# Patient Record
Sex: Male | Born: 1970 | Race: Black or African American | Hispanic: No | Marital: Single | Smoking: Current every day smoker
Health system: Southern US, Community
[De-identification: ages and names within clinical notes are randomized; demographics above are authoritative.]

## PROBLEM LIST (undated history)

## (undated) DIAGNOSIS — G4730 Sleep apnea, unspecified: Secondary | ICD-10-CM

## (undated) DIAGNOSIS — I272 Pulmonary hypertension, unspecified: Secondary | ICD-10-CM

## (undated) DIAGNOSIS — J449 Chronic obstructive pulmonary disease, unspecified: Secondary | ICD-10-CM

## (undated) DIAGNOSIS — J40 Bronchitis, not specified as acute or chronic: Secondary | ICD-10-CM

## (undated) DIAGNOSIS — G629 Polyneuropathy, unspecified: Secondary | ICD-10-CM

## (undated) DIAGNOSIS — H409 Unspecified glaucoma: Secondary | ICD-10-CM

## (undated) DIAGNOSIS — I10 Essential (primary) hypertension: Secondary | ICD-10-CM

---

## 2005-07-25 ENCOUNTER — Ambulatory Visit (HOSPITAL_COMMUNITY): Admission: RE | Admit: 2005-07-25 | Discharge: 2005-07-27 | Payer: Self-pay | Admitting: Orthopedic Surgery

## 2005-07-25 ENCOUNTER — Encounter: Admission: RE | Admit: 2005-07-25 | Discharge: 2005-07-25 | Payer: Self-pay | Admitting: Family Medicine

## 2008-08-31 ENCOUNTER — Encounter: Admission: RE | Admit: 2008-08-31 | Discharge: 2008-08-31 | Payer: Self-pay | Admitting: Family Medicine

## 2010-04-03 ENCOUNTER — Emergency Department (HOSPITAL_COMMUNITY): Admission: EM | Admit: 2010-04-03 | Discharge: 2010-04-03 | Payer: Self-pay | Admitting: Family Medicine

## 2010-11-04 NOTE — H&P (Signed)
NAMEMICHOEL, KUNIN                 ACCOUNT NO.:  [id number]   MEDICAL RECORD NO.:  [id number]          PATIENT TYPE:  AMB   LOCATION:  SDS                          FACILITY:  [facility]   PHYSICIAN:  Myrtie Neither, MD      DATE OF BIRTH:  [birth year]   DATE OF ADMISSION:  07/25/2005  DATE OF DISCHARGE:                                HISTORY & PHYSICAL   CHIEF COMPLAINT:  Painful, deformed right second toe.   PERTINENT HISTORY:  This is a 40 year old male who this past Saturday, four  days ago, had stumped his right foot after getting in a tub basin and by  walking in the dark and sustained an injury to his right second toe with  pain, swelling, some minimal bleeding.  The patient went to Dr. Salena Saner earlier today and was subsequently referred to the office.   PAST MEDICAL HISTORY:  No history of high blood pressure or diabetes.   ALLERGIES:  None known.   MEDICATIONS:  Cialis, Alavert, B12.   SOCIAL HISTORY:  The patient does have a history of use of alcohol and  smokes 1-1/2 packs per day.   FAMILY HISTORY:  Noncontributory.   REVIEW OF SYSTEMS:  Basically as in history of present illness.  No cardiac,  respiratory, urinary or bowel symptoms.   PHYSICAL EXAMINATION:  VITAL SIGNS:  Temperature 97.6, pulse 68,  respirations 16, blood pressure 134/75.  Height 75 inches.  Weight 296.  HEENT:  Head normocephalic.  Anicteric.  Sclerae are clear.  NECK:  Supple.  LUNGS:  Clear.  HEART:  S1 and S2 regular.  EXTREMITIES:  Right foot with tender, swollen right second toe.  Some  erythema with bone protruding through the dorsal wound.  Tender to  palpation.   X-ray reveals fracture, proximal phalanx.   IMPRESSION:  Open fractured phalanx, right second toe.   PLAN:  Irrigation/debridement, partial phalangectomy and open  reduction/internal fixation, right second toe.      Myrtie Neither, MD  Electronically Signed     AC/MEDQ  D:  07/25/2005  T:  07/25/2005  Job:   [id number]

## 2010-11-04 NOTE — H&P (Signed)
[name], Wayne Wilkerson                 ACCOUNT NO.:  [id number]   MEDICAL RECORD NO.:  [id number]          PATIENT TYPE:  AMB   LOCATION:  SDS                          FACILITY:  [facility]   PHYSICIAN:  Myrtie Neither, MD      DATE OF BIRTH:  1970/11/13   DATE OF ADMISSION:  07/25/2005  DATE OF DISCHARGE:                                HISTORY & PHYSICAL   PREOPERATIVE DIAGNOSIS:  Open fracture to proximal phalanx, right second  toe.   POSTOPERATIVE DIAGNOSIS:  Open fracture to proximal phalanx, right second  toe.   ANESTHESIA:  General.   PROCEDURE:  Irrigation and debridement, right second toe, partial  phalangectomy, proximal phalanx, right second toe.  Open reduction/internal  fixation, proximal phalanx, right second toe.  Culture and sensitivity,  wound.   Patient is taken to the operating room after given adequate preoperative  medication.  Given general anesthesia and intubated.  Right foot was  scrubbed with Betadine scrub and painted with Betadine solution.  Draped in  a sterile manner.  Tourniquet and bipolar used for hemostasis.  Transverse  laceration, dorsal aspect of the second toe was extended both proximally and  distally for the exposure of both the proximal and distal fragments.  With  the use of a bone cutter, a partial phalangectomy was done, resecting the  dead ends of the bone. Copious irrigation with Simpulse was done.  The wound  was cultured for aerobics and anaerobic after adequate debridement and  irrigation.  A 6.2 K wire was placed across the fracture site, stabilizing  in anatomic position.  Further irrigation was done, followed by wound  closure with 4-0 nylon.  The pin was bent exterior to the skin, and pin cap  applied.  Compressive dressing was applied.  The patient tolerated the  procedure quite well and went to the recovery room in a stable and  satisfactory condition.      Myrtie Neither, MD  Electronically Signed     AC/MEDQ  D:  07/25/2005   T:  07/25/2005  Job:  [id number]

## 2012-09-24 ENCOUNTER — Institutional Professional Consult (permissible substitution): Payer: Self-pay | Admitting: Medical

## 2013-01-27 ENCOUNTER — Encounter (HOSPITAL_COMMUNITY): Payer: Self-pay | Admitting: *Deleted

## 2013-01-27 ENCOUNTER — Emergency Department (HOSPITAL_COMMUNITY)
Admission: EM | Admit: 2013-01-27 | Discharge: 2013-01-27 | Disposition: A | Discharge status: Home / Self Care | Payer: BC Managed Care – PPO | Attending: Emergency Medicine | Admitting: Emergency Medicine

## 2013-01-27 DIAGNOSIS — R000 Tachycardia, unspecified: Secondary | ICD-10-CM | POA: Insufficient documentation

## 2013-01-27 DIAGNOSIS — F17200 Nicotine dependence, unspecified, uncomplicated: Secondary | ICD-10-CM | POA: Insufficient documentation

## 2013-01-27 DIAGNOSIS — Z79899 Other long term (current) drug therapy: Secondary | ICD-10-CM | POA: Insufficient documentation

## 2013-01-27 DIAGNOSIS — G4730 Sleep apnea, unspecified: Secondary | ICD-10-CM | POA: Insufficient documentation

## 2013-01-27 DIAGNOSIS — F411 Generalized anxiety disorder: Secondary | ICD-10-CM | POA: Insufficient documentation

## 2013-01-27 DIAGNOSIS — K0889 Other specified disorders of teeth and supporting structures: Secondary | ICD-10-CM

## 2013-01-27 DIAGNOSIS — K089 Disorder of teeth and supporting structures, unspecified: Secondary | ICD-10-CM | POA: Insufficient documentation

## 2013-01-27 DIAGNOSIS — Z760 Encounter for issue of repeat prescription: Secondary | ICD-10-CM | POA: Insufficient documentation

## 2013-01-27 HISTORY — DX: Sleep apnea, unspecified: G47.30

## 2013-01-27 MED ORDER — PENICILLIN V POTASSIUM 500 MG PO TABS: 500.0000 mg | ORAL_TABLET | Freq: Four times a day (QID) | ORAL | Status: DC

## 2013-01-27 NOTE — ED Notes (Signed)
Pt states last year during his physical for work he took two BP pills to be able to pass his BP to work. Pt states that his physical is coming up this week and he wants BP medication so he can pass his physical. Pt also complaining of dental pain.

## 2013-01-27 NOTE — ED Provider Notes (Signed)
CSN: [id number]     Arrival date & time 01/27/13  0446 History     First MD Initiated Contact with Patient 01/27/13 [phone]     Chief Complaint  Patient presents with  . 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 Patient states he has had "borderline" blood pressure for a few years. He checked his blood pressure the other day and it was elevated. Patient states he took some blood pressure pills last year, given to him by a co-worker, so he was able to pass his test. Patient denies any current symptoms related to blood pressure. Patient states he also has dental pain for the past few weeks that is aching and mild. The pain "is not that bad." No aggravating/alleviating factors.   Patient is a 42 y.o. male presenting with hypertension.  Hypertension    Past Medical History  Diagnosis Date  . Sleep apnea    History reviewed. No pertinent past surgical history. History reviewed. No pertinent family history. History  Substance Use Topics  . Smoking status: Current Every Day Smoker  . Smokeless tobacco: Not on file  . Alcohol Use: Yes    Review of Systems  Constitutional:       Medication refill  HENT: Positive for dental problem.   All other systems reviewed and are negative.    Allergies  Review of patient's allergies indicates no known allergies.  Home Medications   Current Outpatient Rx  Name  Route  Sig  Dispense  Refill  . fluticasone (FLONASE) 50 MCG/ACT nasal spray   Nasal   Place 2 sprays into the nose daily as needed for rhinitis or allergies.         . Loratadine-Pseudoephedrine (KLS ALLERCLEAR D-12HR PO)   Oral   Take 1 tablet by mouth 2 (two) times daily as needed (for allergies).          BP 139/92   Pulse 105  Temp(Src) 98.2 F (36.8 C) (Oral)  Resp 20  SpO2 93% Physical Exam  Nursing note and vitals reviewed. Constitutional: He is oriented to person, place, and time. He appears well-developed and well-nourished. No distress.  HENT:  Head: Normocephalic and atraumatic.  Mouth/Throat: Oropharynx is clear and moist. No oropharyngeal exudate.  Fair dentition. No tenderness to percussion.   Eyes: Conjunctivae and EOM are normal.  Neck: Normal range of motion.  Cardiovascular: Normal rate and regular rhythm.  Exam reveals no gallop and no friction rub.   No murmur heard. Pulmonary/Chest: Effort normal and breath sounds normal. He has no wheezes. He has no rales. He exhibits no tenderness.  Abdominal: Soft. He exhibits no distension. There is no tenderness. There is no rebound and no guarding.  Musculoskeletal: Normal range of motion.  Neurological: He is alert and oriented to person, place, and time. Coordination normal.  Speech is goal-oriented. Moves limbs without ataxia.   Skin: Skin is warm and dry.  Psychiatric:  Anxious affect.     ED Course   Procedures (including critical care time)  Labs Reviewed - No data to display No results found.  1. Encounter for medication refill   2. Pain, dental     MDM  6:17 AM Patient tachycardic with other vitals stable.   6:39 AM Patient will not have blood pressure medication  here. Patient will have lifestyle and diet recommendations and follow up with a primary car doctor. I have explained to the patient that his request is not legal for me to agree to. Patient will have recommended management by primary care doctor on resource guide. Patient will have penicillin for dental pain. Vitals stable and patient afebrile. No further evaluation needed at this time.    Emilia Beck, [place] 01/27/13 [phone]

## 2013-01-27 NOTE — ED Notes (Signed)
0500  Pt arrives to the room for medication refill and dental pain.  Pt states that he needs some BP meds to pass his work physical.

## 2013-01-28 NOTE — ED Provider Notes (Signed)
Medical screening examination/treatment/procedure(s) were performed by non-physician practitioner and as supervising physician I was immediately available for consultation/collaboration.   Hanley Seamen, MD 01/28/13 [phone]

## 2014-09-10 ENCOUNTER — Ambulatory Visit (INDEPENDENT_AMBULATORY_CARE_PROVIDER_SITE_OTHER): Payer: BLUE CROSS/BLUE SHIELD

## 2014-09-10 ENCOUNTER — Encounter: Payer: Self-pay | Admitting: Podiatry

## 2014-09-10 ENCOUNTER — Ambulatory Visit (INDEPENDENT_AMBULATORY_CARE_PROVIDER_SITE_OTHER): Payer: BLUE CROSS/BLUE SHIELD | Admitting: Podiatry

## 2014-09-10 VITALS — BP 120/73 | HR 103 | Resp 16

## 2014-09-10 DIAGNOSIS — B351 Tinea unguium: Secondary | ICD-10-CM

## 2014-09-10 DIAGNOSIS — Q6650 Congenital pes planus, unspecified foot: Secondary | ICD-10-CM | POA: Diagnosis not present

## 2014-09-10 DIAGNOSIS — M722 Plantar fascial fibromatosis: Secondary | ICD-10-CM | POA: Diagnosis not present

## 2014-09-10 DIAGNOSIS — M79673 Pain in unspecified foot: Secondary | ICD-10-CM | POA: Diagnosis not present

## 2014-09-10 NOTE — Progress Notes (Signed)
   Subjective:    Patient ID: Wayne Wilkerson, male    DOB: June 12, 1971, 44 y.o.   MRN: [id number]  HPI Comments: "I need some new orthotics"  Patient states he needs new orthotics. His last pair from 2012 are worn out and flat.   He also would like to get his toenails cut. Can't get down to feet well to cut them.     Review of Systems  HENT: Positive for sinus pressure.   Musculoskeletal: Positive for gait problem.  All other systems reviewed and are negative.      Objective:   Physical Exam: I have reviewed his past medical history medications allergies surgery social history and review of systems. Pulses are strongly palpable bilateral. Neurologic sensorium is intact bilateral. Deep tendon reflexes are intact bilateral and muscle strength +5 over 5 dorsiflexion plantar flexors and inverters everters all intrinsic musculature is intact. Orthopedic evaluation demonstrates all joints joints distal to the ankle for range of motion without crepitation. He has flexible pes planus bilateral. A history of plantar fasciitis with minimal tenderness on palpation. Cutaneous evaluation demonstrates thick yellow dystrophic onychomycotic nails are painful on palpation.        Assessment & Plan:  Assessment: Pain and limp secondary to onychomycosis pes planus and plantar fasciitis bilateral.  Plan: He was scanned today for set of orthotics and nails were debrided bilaterally.

## 2014-10-20 ENCOUNTER — Ambulatory Visit: Payer: BLUE CROSS/BLUE SHIELD | Admitting: *Deleted

## 2014-10-20 DIAGNOSIS — M722 Plantar fascial fibromatosis: Secondary | ICD-10-CM

## 2014-10-20 NOTE — Patient Instructions (Signed)

## 2014-10-20 NOTE — Progress Notes (Signed)
Patient ID: Sammuel BailiffSean L Lensing, male   DOB: September 26, 1970, 44 y.o.   MRN: [id number] PICKING UP INSERTS

## 2017-04-26 ENCOUNTER — Ambulatory Visit: Payer: BLUE CROSS/BLUE SHIELD | Admitting: Podiatry

## 2017-11-23 ENCOUNTER — Ambulatory Visit: Payer: BLUE CROSS/BLUE SHIELD | Admitting: Podiatry

## 2017-11-27 ENCOUNTER — Ambulatory Visit: Payer: BLUE CROSS/BLUE SHIELD | Admitting: Podiatry

## 2017-11-27 ENCOUNTER — Encounter: Payer: Self-pay | Admitting: Podiatry

## 2017-11-27 VITALS — BP 145/90 | HR 91

## 2017-11-27 DIAGNOSIS — M722 Plantar fascial fibromatosis: Secondary | ICD-10-CM

## 2017-11-27 DIAGNOSIS — M7741 Metatarsalgia, right foot: Secondary | ICD-10-CM | POA: Diagnosis not present

## 2017-11-27 MED ORDER — DICLOFENAC SODIUM 1 % TD GEL: 2.0000 g | Freq: Four times a day (QID) | TRANSDERMAL | 2 refills | Status: DC

## 2017-12-02 NOTE — Progress Notes (Signed)
Subjective:   Patient ID: Sammuel BailiffSean L Keadle, male   DOB: 47 y.o.   MRN: [id number]   HPI 47 year old male presents the office today requesting new orthotics.  Last they were made in 2016 this is they are worn out.  As they were causes her to wear off he started feeling not sensation on the ball of the right side.  He denies any recent injury or trauma to his feet he denies any swelling or redness to his feet.  He has no other concerns today.  He has a history of plantar fasciitis and this is why he had orthotics previously.   Review of Systems  All other systems reviewed and are negative.       Objective:  Physical Exam  General: AAO x3, NAD-was very talkative today and I was unable to actually have a conversation with him.  He kept going in circles but we were able to get a good treatment plan for him.  Dermatological: Skin is warm, dry and supple bilateral. Nails x 10 are well manicured; remaining integument appears unremarkable at this time. There are no open sores, no preulcerative lesions, no rash or signs of infection present.  Vascular: Dorsalis Pedis artery and Posterior Tibial artery pedal pulses are 2/4 bilateral with immedate capillary fill time. There is no pain with calf compression, swelling, warmth, erythema.    Neruologic: Grossly intact via light touch bilateral.  Protective threshold with Semmes Wienstein monofilament intact to all pedal sites bilateral.  Negative Tinel sign.  Musculoskeletal: At this time there is no area pinpoint bony tenderness identified bilaterally.  There is some minimal swelling submetatarsal area 1 through 5 on the right foot compared to contralateral extremity but there is no area of tenderness there is no increase in warmth.  No specific tenderness identified on the plantar fascia today.  Plantar fascia, Achilles tendon appears intact.  Muscular strength 5/5 in all groups tested bilateral.  Gait: Unassisted, Nonantalgic.       Assessment:    Right foot plantar fasciitis, metatarsalgia     Plan:  -Treatment options discussed including all alternatives, risks, and complications -Etiology of symptoms were discussed -Patient declined x-rays today.  I do think that he does have new orthotics He presents today requesting.  He was seen by Raiford Nobleick and he was molded for orthotics.  Also prescribed Voltaren gel.  On clinical exam there is no evidence of acute fracture or stress fracture however if symptoms continue we will get an x-ray.  Discussed gentle range of motion exercises and rehab exercises for plantar fasciitis.  Vivi BarrackMatthew R Wagoner DPM

## 2017-12-05 ENCOUNTER — Telehealth: Payer: Self-pay | Admitting: *Deleted

## 2017-12-05 NOTE — Telephone Encounter (Signed)
[facility] denied Voltaren gel, recommends etodolac, meloxicam, naproxen, nabmetone, ibuprofen.

## 2017-12-06 NOTE — Telephone Encounter (Signed)
Can to topical anti-inflammatory from [facility].

## 2017-12-07 MED ORDER — NONFORMULARY OR COMPOUNDED ITEM: 2 refills | Status: DC

## 2017-12-07 NOTE — Telephone Encounter (Signed)
Left message informing pt his insurance would not cover the diclofenac sodium gel, and Dr. Ardelle AntonWagoner had changed to a compound from [facility] [phone], and they would contact him with the insurance coverage and delivery information. Faxed order to [facility].

## 2017-12-18 ENCOUNTER — Ambulatory Visit (INDEPENDENT_AMBULATORY_CARE_PROVIDER_SITE_OTHER): Payer: BLUE CROSS/BLUE SHIELD | Admitting: Orthotics

## 2017-12-18 DIAGNOSIS — M722 Plantar fascial fibromatosis: Secondary | ICD-10-CM

## 2018-11-29 DIAGNOSIS — I10 Essential (primary) hypertension: Secondary | ICD-10-CM | POA: Insufficient documentation

## 2018-11-29 DIAGNOSIS — R609 Edema, unspecified: Secondary | ICD-10-CM | POA: Insufficient documentation

## 2018-11-29 DIAGNOSIS — K529 Noninfective gastroenteritis and colitis, unspecified: Secondary | ICD-10-CM | POA: Insufficient documentation

## 2018-12-02 DIAGNOSIS — I272 Pulmonary hypertension, unspecified: Secondary | ICD-10-CM | POA: Insufficient documentation

## 2019-04-18 ENCOUNTER — Telehealth: Payer: Self-pay | Admitting: *Deleted

## 2019-04-18 NOTE — Telephone Encounter (Signed)
I informed pt we would need to reevaluate him prior to refill of the voltaren gel due to he has not been seen by Dr. Jacqualyn Posey in almost over 1 1/2 years. Pt states understanding.

## 2019-04-18 NOTE — Telephone Encounter (Signed)
Pt request refill of the Voltaren Gel. Pt has not been seen in office in over 1 year.

## 2019-05-22 DIAGNOSIS — R200 Anesthesia of skin: Secondary | ICD-10-CM | POA: Insufficient documentation

## 2019-05-22 DIAGNOSIS — M79671 Pain in right foot: Secondary | ICD-10-CM | POA: Insufficient documentation

## 2019-06-21 DIAGNOSIS — G629 Polyneuropathy, unspecified: Secondary | ICD-10-CM | POA: Insufficient documentation

## 2019-06-21 DIAGNOSIS — F39 Unspecified mood [affective] disorder: Secondary | ICD-10-CM | POA: Insufficient documentation

## 2019-06-21 DIAGNOSIS — G4733 Obstructive sleep apnea (adult) (pediatric): Secondary | ICD-10-CM | POA: Insufficient documentation

## 2019-10-08 ENCOUNTER — Other Ambulatory Visit: Payer: Self-pay

## 2019-10-08 ENCOUNTER — Encounter: Payer: Self-pay | Admitting: Emergency Medicine

## 2019-10-08 DIAGNOSIS — Z20822 Contact with and (suspected) exposure to covid-19: Secondary | ICD-10-CM | POA: Diagnosis present

## 2019-10-08 DIAGNOSIS — F17200 Nicotine dependence, unspecified, uncomplicated: Secondary | ICD-10-CM | POA: Diagnosis present

## 2019-10-08 DIAGNOSIS — Z79899 Other long term (current) drug therapy: Secondary | ICD-10-CM

## 2019-10-08 DIAGNOSIS — H409 Unspecified glaucoma: Secondary | ICD-10-CM | POA: Diagnosis present

## 2019-10-08 DIAGNOSIS — Z6841 Body Mass Index (BMI) 40.0 and over, adult: Secondary | ICD-10-CM

## 2019-10-08 DIAGNOSIS — A0472 Enterocolitis due to Clostridium difficile, not specified as recurrent: Secondary | ICD-10-CM | POA: Diagnosis present

## 2019-10-08 DIAGNOSIS — Z7951 Long term (current) use of inhaled steroids: Secondary | ICD-10-CM

## 2019-10-08 DIAGNOSIS — E871 Hypo-osmolality and hyponatremia: Secondary | ICD-10-CM | POA: Diagnosis present

## 2019-10-08 DIAGNOSIS — N179 Acute kidney failure, unspecified: Principal | ICD-10-CM | POA: Diagnosis present

## 2019-10-08 DIAGNOSIS — E860 Dehydration: Secondary | ICD-10-CM | POA: Diagnosis present

## 2019-10-08 DIAGNOSIS — J449 Chronic obstructive pulmonary disease, unspecified: Secondary | ICD-10-CM | POA: Diagnosis present

## 2019-10-08 DIAGNOSIS — I272 Pulmonary hypertension, unspecified: Secondary | ICD-10-CM | POA: Diagnosis present

## 2019-10-08 DIAGNOSIS — I10 Essential (primary) hypertension: Secondary | ICD-10-CM | POA: Diagnosis present

## 2019-10-08 DIAGNOSIS — Z791 Long term (current) use of non-steroidal anti-inflammatories (NSAID): Secondary | ICD-10-CM

## 2019-10-08 LAB — COMPREHENSIVE METABOLIC PANEL
ALT: 42 U/L (ref 0–44)
AST: 43 U/L — ABNORMAL HIGH (ref 15–41)
Albumin: 4.2 g/dL (ref 3.5–5.0)
Alkaline Phosphatase: 84 U/L (ref 38–126)
Anion gap: 15 (ref 5–15)
BUN: 29 mg/dL — ABNORMAL HIGH (ref 6–20)
CO2: 21 mmol/L — ABNORMAL LOW (ref 22–32)
Calcium: 8.8 mg/dL — ABNORMAL LOW (ref 8.9–10.3)
Chloride: 98 mmol/L (ref 98–111)
Creatinine, Ser: 5.11 mg/dL — ABNORMAL HIGH (ref 0.61–1.24)
GFR calc Af Amer: 14 mL/min — ABNORMAL LOW (ref >60)
GFR calc non Af Amer: 12 mL/min — ABNORMAL LOW (ref >60)
Glucose, Bld: 116 mg/dL — ABNORMAL HIGH (ref 70–99)
Potassium: 3.7 mmol/L (ref 3.5–5.1)
Sodium: 134 mmol/L — ABNORMAL LOW (ref 135–145)
Total Bilirubin: 0.6 mg/dL (ref 0.3–1.2)
Total Protein: 7.4 g/dL (ref 6.5–8.1)

## 2019-10-08 LAB — CBC WITH DIFFERENTIAL/PLATELET
Abs Immature Granulocytes: 0.15 10*3/uL — ABNORMAL HIGH (ref 0.00–0.07)
Basophils Absolute: 0.1 10*3/uL (ref 0.0–0.1)
Basophils Relative: 0 %
Eosinophils Absolute: 0.3 10*3/uL (ref 0.0–0.5)
Eosinophils Relative: 3 %
HCT: 42.1 % (ref 39.0–52.0)
Hemoglobin: 14 g/dL (ref 13.0–17.0)
Immature Granulocytes: 1 %
Lymphocytes Relative: 23 %
Lymphs Abs: 2.7 10*3/uL (ref 0.7–4.0)
MCH: 34 pg (ref 26.0–34.0)
MCHC: 33.3 g/dL (ref 30.0–36.0)
MCV: 102.2 fL — ABNORMAL HIGH (ref 80.0–100.0)
Monocytes Absolute: 1.2 10*3/uL — ABNORMAL HIGH (ref 0.1–1.0)
Monocytes Relative: 11 %
Neutro Abs: 7.2 10*3/uL (ref 1.7–7.7)
Neutrophils Relative %: 62 %
Platelets: 173 10*3/uL (ref 150–400)
RBC: 4.12 MIL/uL — ABNORMAL LOW (ref 4.22–5.81)
RDW: 14.8 % (ref 11.5–15.5)
WBC: 11.6 10*3/uL — ABNORMAL HIGH (ref 4.0–10.5)
nRBC: 0.3 % — ABNORMAL HIGH (ref 0.0–0.2)

## 2019-10-08 LAB — URINALYSIS, COMPLETE (UACMP) WITH MICROSCOPIC
Bacteria, UA: NONE SEEN
Glucose, UA: NEGATIVE mg/dL
Hgb urine dipstick: NEGATIVE
Ketones, ur: 5 mg/dL — AB
Nitrite: NEGATIVE
Protein, ur: 100 mg/dL — AB
Specific Gravity, Urine: 1.024 (ref 1.005–1.030)
pH: 5 (ref 5.0–8.0)

## 2019-10-08 LAB — TYPE AND SCREEN
ABO/RH(D): AB POS
Antibody Screen: NEGATIVE

## 2019-10-08 LAB — LIPASE, BLOOD: Lipase: 29 U/L (ref 11–51)

## 2019-10-08 NOTE — ED Triage Notes (Signed)
Patient ambulatory to triage with steady gait, without difficulty or distress noted, mask in place; Pt st diarrhea for "several years"; noted blood last several days ago with diff urinating; c/o pain in left side

## 2019-10-09 ENCOUNTER — Emergency Department: Payer: BLUE CROSS/BLUE SHIELD

## 2019-10-09 ENCOUNTER — Inpatient Hospital Stay
Admission: EM | Admit: 2019-10-09 | Discharge: 2019-10-10 | DRG: 683 | Disposition: A | Discharge status: Home / Self Care | Payer: BLUE CROSS/BLUE SHIELD | Attending: Internal Medicine | Admitting: Internal Medicine

## 2019-10-09 ENCOUNTER — Inpatient Hospital Stay: Payer: BLUE CROSS/BLUE SHIELD

## 2019-10-09 DIAGNOSIS — J449 Chronic obstructive pulmonary disease, unspecified: Secondary | ICD-10-CM

## 2019-10-09 DIAGNOSIS — Z20822 Contact with and (suspected) exposure to covid-19: Secondary | ICD-10-CM | POA: Diagnosis present

## 2019-10-09 DIAGNOSIS — Z79899 Other long term (current) drug therapy: Secondary | ICD-10-CM | POA: Diagnosis not present

## 2019-10-09 DIAGNOSIS — Z791 Long term (current) use of non-steroidal anti-inflammatories (NSAID): Secondary | ICD-10-CM | POA: Diagnosis not present

## 2019-10-09 DIAGNOSIS — Z7951 Long term (current) use of inhaled steroids: Secondary | ICD-10-CM | POA: Diagnosis not present

## 2019-10-09 DIAGNOSIS — E860 Dehydration: Secondary | ICD-10-CM | POA: Diagnosis present

## 2019-10-09 DIAGNOSIS — R197 Diarrhea, unspecified: Secondary | ICD-10-CM | POA: Diagnosis not present

## 2019-10-09 DIAGNOSIS — H409 Unspecified glaucoma: Secondary | ICD-10-CM | POA: Diagnosis present

## 2019-10-09 DIAGNOSIS — N179 Acute kidney failure, unspecified: Secondary | ICD-10-CM | POA: Diagnosis present

## 2019-10-09 DIAGNOSIS — I272 Pulmonary hypertension, unspecified: Secondary | ICD-10-CM | POA: Diagnosis present

## 2019-10-09 DIAGNOSIS — E871 Hypo-osmolality and hyponatremia: Secondary | ICD-10-CM | POA: Diagnosis present

## 2019-10-09 DIAGNOSIS — F17200 Nicotine dependence, unspecified, uncomplicated: Secondary | ICD-10-CM | POA: Diagnosis present

## 2019-10-09 DIAGNOSIS — A0472 Enterocolitis due to Clostridium difficile, not specified as recurrent: Secondary | ICD-10-CM | POA: Diagnosis not present

## 2019-10-09 DIAGNOSIS — I10 Essential (primary) hypertension: Secondary | ICD-10-CM

## 2019-10-09 DIAGNOSIS — Z6841 Body Mass Index (BMI) 40.0 and over, adult: Secondary | ICD-10-CM | POA: Diagnosis not present

## 2019-10-09 HISTORY — DX: Unspecified glaucoma: H40.9

## 2019-10-09 HISTORY — DX: Essential (primary) hypertension: I10

## 2019-10-09 HISTORY — DX: Pulmonary hypertension, unspecified: I27.20

## 2019-10-09 HISTORY — DX: Chronic obstructive pulmonary disease, unspecified: J44.9

## 2019-10-09 HISTORY — DX: Polyneuropathy, unspecified: G62.9

## 2019-10-09 HISTORY — DX: Bronchitis, not specified as acute or chronic: J40

## 2019-10-09 LAB — PROTEIN / CREATININE RATIO, URINE
Creatinine, Urine: 389 mg/dL
Protein Creatinine Ratio: 0.12 mg/mg{Cre} (ref 0.00–0.15)
Total Protein, Urine: 46 mg/dL

## 2019-10-09 LAB — RESPIRATORY PANEL BY RT PCR (FLU A&B, COVID)
Influenza A by PCR: NEGATIVE
Influenza B by PCR: NEGATIVE
SARS Coronavirus 2 by RT PCR: NEGATIVE

## 2019-10-09 LAB — LACTOFERRIN, FECAL, QUALITATIVE: Lactoferrin, Fecal, Qual: POSITIVE — AB

## 2019-10-09 MED ORDER — SODIUM CHLORIDE 0.9 % IV BOLUS
1000.0000 mL | Freq: Once | INTRAVENOUS | Status: AC
  Administered 2019-10-09: 1000 mL via INTRAVENOUS

## 2019-10-09 MED ORDER — ALUM & MAG HYDROXIDE-SIMETH 200-200-20 MG/5ML PO SUSP: 30.0000 mL | ORAL | Status: DC | PRN

## 2019-10-09 MED ORDER — ONDANSETRON HCL 4 MG/2ML IJ SOLN: 4.0000 mg | Freq: Four times a day (QID) | INTRAMUSCULAR | Status: DC | PRN

## 2019-10-09 MED ORDER — ONDANSETRON HCL 4 MG/2ML IJ SOLN: 4.0000 mg | Freq: Once | INTRAMUSCULAR | Status: AC

## 2019-10-09 MED ORDER — CALCIUM CARBONATE ANTACID 500 MG PO CHEW
2.0000 | CHEWABLE_TABLET | Freq: Three times a day (TID) | ORAL | Status: DC
  Administered 2019-10-09 – 2019-10-10 (×5): 400 mg via ORAL
  Filled 2019-10-09 (×5): qty 2

## 2019-10-09 MED ORDER — ONDANSETRON HCL 4 MG/2ML IJ SOLN
INTRAMUSCULAR | Status: AC
  Administered 2019-10-09: 4 mg via INTRAVENOUS
  Filled 2019-10-09: qty 2

## 2019-10-09 MED ORDER — LATANOPROST 0.005 % OP SOLN
1.0000 [drp] | Freq: Every day | OPHTHALMIC | Status: DC
  Administered 2019-10-09: 1 [drp] via OPHTHALMIC
  Filled 2019-10-09: qty 2.5

## 2019-10-09 MED ORDER — LORATADINE 10 MG PO TABS
10.0000 mg | ORAL_TABLET | Freq: Every day | ORAL | Status: DC
  Administered 2019-10-09 – 2019-10-10 (×2): 10 mg via ORAL
  Filled 2019-10-09 (×2): qty 1

## 2019-10-09 MED ORDER — SODIUM CHLORIDE 0.9 % IV SOLN: INTRAVENOUS | Status: DC

## 2019-10-09 MED ORDER — NICOTINE 21 MG/24HR TD PT24
21.0000 mg | MEDICATED_PATCH | Freq: Every day | TRANSDERMAL | Status: DC
  Administered 2019-10-09 – 2019-10-10 (×2): 21 mg via TRANSDERMAL
  Filled 2019-10-09 (×2): qty 1

## 2019-10-09 MED ORDER — PANTOPRAZOLE SODIUM 40 MG PO TBEC
40.0000 mg | DELAYED_RELEASE_TABLET | Freq: Every day | ORAL | Status: DC
  Administered 2019-10-09 – 2019-10-10 (×2): 40 mg via ORAL
  Filled 2019-10-09 (×2): qty 1

## 2019-10-09 MED ORDER — TRAZODONE HCL 50 MG PO TABS: 25.0000 mg | ORAL_TABLET | Freq: Every evening | ORAL | Status: DC | PRN

## 2019-10-09 MED ORDER — ACETAMINOPHEN 650 MG RE SUPP: 650.0000 mg | Freq: Four times a day (QID) | RECTAL | Status: DC | PRN

## 2019-10-09 MED ORDER — ONDANSETRON HCL 4 MG PO TABS: 4.0000 mg | ORAL_TABLET | Freq: Four times a day (QID) | ORAL | Status: DC | PRN

## 2019-10-09 MED ORDER — ACETAMINOPHEN 325 MG PO TABS: 650.0000 mg | ORAL_TABLET | Freq: Four times a day (QID) | ORAL | Status: DC | PRN

## 2019-10-09 MED ORDER — HEPARIN SODIUM (PORCINE) 5000 UNIT/ML IJ SOLN
5000.0000 [IU] | Freq: Three times a day (TID) | INTRAMUSCULAR | Status: DC
  Administered 2019-10-09 – 2019-10-10 (×2): 5000 [IU] via SUBCUTANEOUS
  Filled 2019-10-09 (×2): qty 1

## 2019-10-09 NOTE — TOC Initial Note (Signed)
Transition of Care [facility]) - Initial/Assessment Note    Patient Details  Name: Wayne Wilkerson MRN: [id number] Date of Birth: 23-Mar-1971  [facility]) CM/SW Contact:    Wheaton Cellar, RN Phone Number: 10/09/2019, 3:35 PM  Clinical Narrative:                 Patient sleeping in bed with CPAP in place. ED RN advised patient would be moving to medical bed in few minutes. TOC will reassess once patient admitted to hospital room.         Patient Goals and CMS Choice        Expected Discharge Plan and Services                                                Prior Living Arrangements/Services                       Activities of Daily Living      Permission Sought/Granted                  Emotional Assessment              Admission diagnosis:  AKI (acute kidney injury) (HCC) [N17.9] Patient Active Problem List   Diagnosis Date Noted  . AKI (acute kidney injury) (HCC) 10/09/2019   PCP:  Olena Leatherwood, FNP Pharmacy:   [facility] [id number] - [place], [place] - [address] [facility] [address] [facility] [address] [place] [address] Phone: [phone] Fax: [phone]     Social Determinants of Health (SDOH) Interventions    Readmission Risk Interventions No flowsheet data found.

## 2019-10-09 NOTE — ED Notes (Signed)
Pt given breakfast tray

## 2019-10-09 NOTE — ED Notes (Signed)
Report given to Misty, RN

## 2019-10-09 NOTE — ED Notes (Signed)
Respiratory at bedside to set up cpap for pt as pt wears it at night when sleeping

## 2019-10-09 NOTE — ED Provider Notes (Signed)
[facility] Emergency Department Provider Note  ____________________________________________   First MD Initiated Contact with Patient 10/09/19 [phone]     (approximate)  I have reviewed the triage vital signs and the nursing notes.   HISTORY  Chief Complaint Rectal Bleeding   HPI Wayne Wilkerson is a 49 y.o. male with below list of previous medical conditions presents to the emergency department secondary to nausea, left flank pain, decreased urination over the past 3 days.  Patient states his current discomfort is 1 out of 10.  Patient denies any aggravating or alleviating factors.  Patient denies any fever.  Patient denies any hematuria or dysuria.  Patient does admit to diarrhea intermittently which he states has been occurring for years.    Past Medical History:  Diagnosis Date  . Bronchitis   . COPD (chronic obstructive pulmonary disease) (HCC)   . Glaucoma   . Hypertension   . Neuropathy   . Pulmonary hypertension (HCC)   . Sleep apnea     Patient Active Problem List   Diagnosis Date Noted  . AKI (acute kidney injury) (HCC) 10/09/2019    History reviewed. No pertinent surgical history.  Prior to Admission medications   Medication Sig Start Date End Date Taking? Authorizing Provider  diclofenac sodium (VOLTAREN) 1 % GEL Apply 2 g topically 4 (four) times daily. Rub into affected area of foot 2 to 4 times daily 11/27/17   Vivi Barrack, DPM  Fexofenadine HCl [facility] ALLERGY PO) Take by mouth.    [provider]  latanoprost (XALATAN) 0.005 % ophthalmic solution 1 drop at bedtime.    [provider]  lisinopril-hydrochlorothiazide (PRINZIDE,ZESTORETIC) 20-25 MG per tablet Take 1 tablet by mouth daily.    [provider]  Loratadine (CLARITIN PO) Take by mouth.    [provider]  NONFORMULARY OR COMPOUNDED ITEM [facility]:  Antiinflammatory Cream - Diclofenac 3%, Baclofen 2%, Lidocaine 2%, apply 1-2  grams to affected area 3-4 times a day. 12/07/17   Vivi Barrack, DPM  Triamcinolone Acetonide (NASACORT AQ NA) Place into the nose.    [provider]    Allergies Patient has no known allergies.  No family history on file.  Social History Social History   Tobacco Use  . Smoking status: Current Every Day Smoker  . Smokeless tobacco: Never Used  Substance Use Topics  . Alcohol use: Yes  . Drug use: No    Review of Systems Constitutional: No fever/chills Eyes: No visual changes. ENT: No sore throat. Cardiovascular: Denies chest pain. Respiratory: Denies shortness of breath. Gastrointestinal: Positive for left flank pain and nausea..  No diarrhea.  No constipation. Genitourinary: Positive for decreased urination Musculoskeletal: Negative for neck pain.  Negative for back pain. Integumentary: Negative for rash. Neurological: Negative for headaches, focal weakness or numbness.  ____________________________________________   PHYSICAL EXAM:  VITAL SIGNS: ED Triage Vitals  Enc Vitals Group     BP 10/08/19 2151 (!) 100/56     Pulse Rate 10/08/19 2151 (!) 110     Resp 10/08/19 2151 20     Temp 10/08/19 2151 97.9 F (36.6 C)     Temp Source 10/08/19 2151 Oral     SpO2 10/08/19 2151 95 %     Weight 10/08/19 2146 (!) 172.8 kg (381 lb)     Height 10/08/19 2146 1.88 m (6\' 2" )     Head Circumference --      Peak Flow --  Pain Score 10/08/19 2146 1     Pain Loc --      Pain Edu? --      Excl. in GC? --     Constitutional: Alert and oriented.  Eyes: Conjunctivae are normal.  Mouth/Throat: Patient is wearing a mask. Neck: No stridor.  No meningeal signs.   Cardiovascular: Normal rate, regular rhythm. Good peripheral circulation. Grossly normal heart sounds. Respiratory: Normal respiratory effort.  No retractions. Gastrointestinal: Soft and nontender. No distention.  Musculoskeletal: No lower extremity tenderness nor edema. No gross deformities of  extremities. Neurologic:  Normal speech and language. No gross focal neurologic deficits are appreciated.  Skin:  Skin is warm, dry and intact. Psychiatric: Mood and affect are normal. Speech and behavior are normal.  ____________________________________________   LABS (all labs ordered are listed, but only abnormal results are displayed)  Labs Reviewed  CBC WITH DIFFERENTIAL/PLATELET - Abnormal; Notable for the following components:      Result Value   WBC 11.6 (*)    RBC 4.12 (*)    MCV 102.2 (*)    nRBC 0.3 (*)    Monocytes Absolute 1.2 (*)    Abs Immature Granulocytes 0.15 (*)    All other components within normal limits  COMPREHENSIVE METABOLIC PANEL - Abnormal; Notable for the following components:   Sodium 134 (*)    CO2 21 (*)    Glucose, Bld 116 (*)    BUN 29 (*)    Creatinine, Ser 5.11 (*)    Calcium 8.8 (*)    AST 43 (*)    GFR calc non Af Amer 12 (*)    GFR calc Af Amer 14 (*)    All other components within normal limits  URINALYSIS, COMPLETE (UACMP) WITH MICROSCOPIC - Abnormal; Notable for the following components:   Color, Urine AMBER (*)    APPearance CLOUDY (*)    Bilirubin Urine SMALL (*)    Ketones, ur 5 (*)    Protein, ur 100 (*)    Leukocytes,Ua TRACE (*)    All other components within normal limits  LIPASE, BLOOD  TYPE AND SCREEN   __________  RADIOLOGY I, [place], personally viewed and evaluated these images (plain radiographs) as part of my medical decision making, as well as reviewing the written report by the radiologist.  ED MD interpretation: No acute findings noted on CT renal study per radiologist.  Official radiology report(s): CT Renal Stone Study  Result Date: 10/09/2019 CLINICAL DATA:  Flank pain. Kidney stone suspected. Left-sided pain. Difficulty urinating. EXAM: CT ABDOMEN AND PELVIS WITHOUT CONTRAST TECHNIQUE: Multidetector CT imaging of the abdomen and pelvis was performed following the standard protocol without IV  contrast. COMPARISON:  None. FINDINGS: Lower chest: Lung bases are clear without focal nodule, mass, or airspace disease. The heart size is normal. No significant pleural or pericardial effusion is present. Hepatobiliary: Hepatic steatosis is evident. No discrete lesions are present. The common bile duct and gallbladder scratched at density in the gallbladder likely represents sludge. No discrete stone is evident. Inflammatory changes present. Pancreas: Unremarkable. No pancreatic ductal dilatation or surrounding inflammatory changes. Spleen: Normal in size without focal abnormality. Adrenals/Urinary Tract: Adrenal glands are normal bilaterally. Kidneys and ureters are within normal limits. No stone or mass lesion is present. The urinary bladder is within normal limits. Stomach/Bowel: Stomach and duodenum are within normal limits. Small bowel is unremarkable. Terminal ileum is within normal limits. Appendix is visualized and normal. The ascending and transverse colon are normal.  Descending colon is unremarkable. Diverticular changes are present throughout the sigmoid colon. No focal inflammation is evident to suggest diverticulitis. Vascular/Lymphatic: Atherosclerotic changes are present in the aorta branch vessels without aneurysm. No significant retroperitoneal adenopathy is present. Reproductive: Prostate is unremarkable. Other: No abdominal wall hernia or abnormality. No abdominopelvic ascites. Musculoskeletal: Vacuum disc is present at L5-S1. Degenerative changes contribute to central and foraminal narrowing at L4-5 and L5-S1. Vertebral body heights are maintained. No focal lytic or blastic lesions are present. Bony pelvis is normal. Hips are located and within normal limits. IMPRESSION: 1. No acute or focal lesion to explain the patient's symptoms. 2. Hepatic steatosis. 3. Sigmoid diverticulosis without diverticulitis. 4. Degenerative changes in the lower lumbar spine. 5. Aortic Atherosclerosis (ICD10-I70.0).  Electronically Signed   By: San Morelle M.D.   On: 10/09/2019 04:57     Procedures   ____________________________________________   INITIAL IMPRESSION / MDM / ASSESSMENT AND PLAN / ED COURSE  As part of my medical decision making, I reviewed the following data within the electronic MEDICAL RECORD NUMBER  49 year old male presented with above-stated history and physical exam with laboratory data consistent with acute kidney injury.  Patient's creatinine 5.11 with a GFR 14.  Urinalysis revealed proteinuria.  Patient given 1 L IV normal saline in the emergency department.  Patient discussed with Dr. Danella Penton for hospital admission for further evaluation and management of acute kidney injury.  ____________________________________________  FINAL CLINICAL IMPRESSION(S) / ED DIAGNOSES  Final diagnoses:  Acute kidney injury ([place])     MEDICATIONS GIVEN DURING THIS VISIT:  Medications  sodium chloride 0.9 % bolus 1,000 mL (1,000 mLs Intravenous New Bag/Given 10/09/19 0407)  sodium chloride 0.9 % bolus 1,000 mL (1,000 mLs Intravenous New Bag/Given 10/09/19 0530)  ondansetron (ZOFRAN) injection 4 mg (4 mg Intravenous Given 10/09/19 0529)     ED Discharge Orders    None      *Please note:  OLEY LAHAIE was evaluated in Emergency Department on 10/09/2019 for the symptoms described in the history of present illness. He was evaluated in the context of the global COVID-19 pandemic, which necessitated consideration that the patient might be at risk for infection with the SARS-CoV-2 virus that causes COVID-19. Institutional protocols and algorithms that pertain to the evaluation of patients at risk for COVID-19 are in a state of rapid change based on information released by regulatory bodies including the CDC and federal and state organizations. These policies and algorithms were followed during the patient's care in the ED.  Some ED evaluations and interventions may be delayed as a result of  limited staffing during the pandemic.*  Note:  This document was prepared using Dragon voice recognition software and may include unintentional dictation errors.   Gregor Hams, MD 10/09/19 [phone]

## 2019-10-09 NOTE — H&P (Signed)
[place] at [facility]   PATIENT NAME: Wayne Wilkerson    MR#:  [id number]  DATE OF BIRTH:  1971-02-04  DATE OF ADMISSION:  10/09/2019  PRIMARY CARE PHYSICIAN: Olena Leatherwood, FNP   REQUESTING/REFERRING PHYSICIAN: Bayard Males, MD CHIEF COMPLAINT:   Chief Complaint  Patient presents with  . Rectal Bleeding    HISTORY OF PRESENT ILLNESS:  Wayne Wilkerson  is a 49 y.o. morbidly obese African-American male with a known history of COPD, hypertension, peripheral neuropathy and obstructive sleep apnea, who presented to the emergency room with acute onset of left-sided abdominal pain and recent diminished urine output over the last 3 days and ongoing slightly worsening diarrhea with 3-4 watery bowel movements today.  He admits to nausea without vomiting.  No fever or chills.  Is been having diminished appetite especially with p.o. fluids.  No cough or wheezing beyond his baseline.  No other headache however is been having occasional dizziness.  No worsening lower extremity edema.Marland Kitchen  Upon presentation to the emergency room, blood pressure was 100/56 with a heart rate of 110 with otherwise normal vital signs.  Labs revealed a BUN of 29 and creatinine of 5.11 with no previous levels for comparison.  He had mild hyponatremia and AST 43.  CBC showed minimal leukocytosis.  UA showed 11-20 WBCs, hyaline casts 100 protein and 5 ketones.  Spiral CT showed sigmoid diverticulosis without diverticulitis, degenerative changes in the lower lumbar spine, aortic atherosclerosis, hepatic steatosis with no acute or focal lesion to explain the patient's symptoms.  The patient was given 4 mg of IV Zofran and 2 L bolus of IV normal saline.  He will be admitted to a medical monitored bed for further evaluation and management. PAST MEDICAL HISTORY:   Past Medical History:  Diagnosis Date  . Bronchitis   . COPD (chronic obstructive pulmonary disease) (HCC)   . Glaucoma   . Hypertension   . Neuropathy     . Pulmonary hypertension (HCC)   . Sleep apnea     PAST SURGICAL HISTORY:  History reviewed. No pertinent surgical history.  He denies any previous surgeries.  SOCIAL HISTORY:   Social History   Tobacco Use  . Smoking status: Current Every Day Smoker  . Smokeless tobacco: Never Used  Substance Use Topics  . Alcohol use: Yes    FAMILY HISTORY:  No family history on file.  No pertinent familial diseases were reported.  DRUG ALLERGIES:  No Known Allergies  REVIEW OF SYSTEMS:   ROS As per history of present illness. All pertinent systems were reviewed above. Constitutional,  HEENT, cardiovascular, respiratory, GI, GU, musculoskeletal, neuro, psychiatric, endocrine,  integumentary and hematologic systems were reviewed and are otherwise  negative/unremarkable except for positive findings mentioned above in the HPI.   MEDICATIONS AT HOME:   Prior to Admission medications   Medication Sig Start Date End Date Taking? Authorizing Provider  diclofenac sodium (VOLTAREN) 1 % GEL Apply 2 g topically 4 (four) times daily. Rub into affected area of foot 2 to 4 times daily 11/27/17   Vivi Barrack, DPM  Fexofenadine HCl [facility] ALLERGY PO) Take by mouth.    [provider]  latanoprost (XALATAN) 0.005 % ophthalmic solution 1 drop at bedtime.    [provider]  lisinopril-hydrochlorothiazide (PRINZIDE,ZESTORETIC) 20-25 MG per tablet Take 1 tablet by mouth daily.    [provider]  Loratadine (CLARITIN PO) Take by mouth.    [provider]  NONFORMULARY OR COMPOUNDED  [place]:  Antiinflammatory Cream - Diclofenac 3%, Baclofen 2%, Lidocaine 2%, apply 1-2 grams to affected area 3-4 times a day. 12/07/17   Trula Slade, DPM  Triamcinolone Acetonide (NASACORT AQ NA) Place into the nose.    [provider]      VITAL SIGNS:  Blood pressure 102/73, pulse 95, temperature 97.9 F (36.6 C), temperature source Oral, resp.  rate 16, height 6\' 2"  (1.88 m), weight (!) 172.8 kg, SpO2 98 %.  PHYSICAL EXAMINATION:  Physical Exam  GENERAL:  49 y.o.-year-old obese African-American male patient lying in the bed with no acute distress.  EYES: Pupils equal, round, reactive to light and accommodation. No scleral icterus. Extraocular muscles intact.  HEENT: Head atraumatic, normocephalic. Oropharynx and nasopharynx clear.  NECK:  Supple, no jugular venous distention. No thyroid enlargement, no tenderness.  LUNGS: Normal breath sounds bilaterally, no wheezing, rales,rhonchi or crepitation. No use of accessory muscles of respiration.  CARDIOVASCULAR: Regular rate and rhythm, S1, S2 normal. No murmurs, rubs, or gallops.  ABDOMEN: Soft, nondistended, nontender. Bowel sounds present. No organomegaly or mass.  EXTREMITIES: No pedal edema, cyanosis, or clubbing.  NEUROLOGIC: Cranial nerves II through XII are intact. Muscle strength 5/5 in all extremities. Sensation intact. Gait not checked.  PSYCHIATRIC: The patient is alert and oriented x 3.  Normal affect and good eye contact. SKIN: No obvious rash, lesion, or ulcer.   LABORATORY PANEL:   CBC Recent Labs  Lab 10/08/19 2155  WBC 11.6*  HGB 14.0  HCT 42.1  PLT 173   ------------------------------------------------------------------------------------------------------------------  Chemistries  Recent Labs  Lab 10/08/19 2155  NA 134*  K 3.7  CL 98  CO2 21*  GLUCOSE 116*  BUN 29*  CREATININE 5.11*  CALCIUM 8.8*  AST 43*  ALT 42  ALKPHOS 84  BILITOT 0.6   ------------------------------------------------------------------------------------------------------------------  Cardiac Enzymes No results for input(s): TROPONINI in the last 168 hours. ------------------------------------------------------------------------------------------------------------------  RADIOLOGY:  CT Renal Stone Study  Result Date: 10/09/2019 CLINICAL DATA:  Flank pain. Kidney stone  suspected. Left-sided pain. Difficulty urinating. EXAM: CT ABDOMEN AND PELVIS WITHOUT CONTRAST TECHNIQUE: Multidetector CT imaging of the abdomen and pelvis was performed following the standard protocol without IV contrast. COMPARISON:  None. FINDINGS: Lower chest: Lung bases are clear without focal nodule, mass, or airspace disease. The heart size is normal. No significant pleural or pericardial effusion is present. Hepatobiliary: Hepatic steatosis is evident. No discrete lesions are present. The common bile duct and gallbladder scratched at density in the gallbladder likely represents sludge. No discrete stone is evident. Inflammatory changes present. Pancreas: Unremarkable. No pancreatic ductal dilatation or surrounding inflammatory changes. Spleen: Normal in size without focal abnormality. Adrenals/Urinary Tract: Adrenal glands are normal bilaterally. Kidneys and ureters are within normal limits. No stone or mass lesion is present. The urinary bladder is within normal limits. Stomach/Bowel: Stomach and duodenum are within normal limits. Small bowel is unremarkable. Terminal ileum is within normal limits. Appendix is visualized and normal. The ascending and transverse colon are normal. Descending colon is unremarkable. Diverticular changes are present throughout the sigmoid colon. No focal inflammation is evident to suggest diverticulitis. Vascular/Lymphatic: Atherosclerotic changes are present in the aorta branch vessels without aneurysm. No significant retroperitoneal adenopathy is present. Reproductive: Prostate is unremarkable. Other: No abdominal wall hernia or abnormality. No abdominopelvic ascites. Musculoskeletal: Vacuum disc is present at L5-S1. Degenerative changes contribute to central and foraminal narrowing at L4-5 and L5-S1. Vertebral body heights are maintained. No focal lytic or blastic lesions are  present. Bony pelvis is normal. Hips are located and within normal limits. IMPRESSION: 1. No acute  or focal lesion to explain the patient's symptoms. 2. Hepatic steatosis. 3. Sigmoid diverticulosis without diverticulitis. 4. Degenerative changes in the lower lumbar spine. 5. Aortic Atherosclerosis (ICD10-I70.0). Electronically Signed   By: Marin Roberts M.D.   On: 10/09/2019 04:57      IMPRESSION AND PLAN:  1.  Acute kidney injury. -This is likely prerenal, possibly hypovolemic it could be related to diarrhea. -The patient will be admitted to a medically monitored bed. -We will hold off Zestril and diuretics. -We will continue hydration with IV normal saline. -We will follow serial BMPs. -We will obtain bilateral renal ultrasound. -Nephrology consultation will be obtained. -I notified Dr. Cherylann Ratel about the patient. -We will obtain stool studies  2.  Essential hypertension. -The patient will be placed on as needed IV labetalol. -Zestoretic is being held off given acute kidney injury.  3.  Glaucoma -We will continue his ophthalmic GTT.  4.  COPD. -The patient will be placed on as needed duo nebs.  5.  DVT prophylaxis. -Subcutaneous heparin   All the records are reviewed and case discussed with ED provider. The plan of care was discussed in details with the patient (and family). I answered all questions. The patient agreed to proceed with the above mentioned plan. Further management will depend upon hospital course.   CODE STATUS: Full code  Status is: Inpatient  Remains inpatient appropriate because:Ongoing diagnostic testing needed not appropriate for outpatient work up, Unsafe d/c plan, IV treatments appropriate due to intensity of illness or inability to take PO and Inpatient level of care appropriate due to severity of illness   Dispo: The patient is from: Home              Anticipated d/c is to: Home              Anticipated d/c date is: 2 days              Patient currently is not medically stable to d/c.    TOTAL TIME TAKING CARE OF THIS PATIENT: 50  minutes.    Hannah Beat M.D on 10/09/2019 at 6:38 AM  [facility]   From 7 PM-7 AM, contact night-coverage [email or web address]  CC: Primary care physician; Olena Leatherwood, FNP   Note: This dictation was prepared with Dragon dictation along with smaller phrase technology. Any transcriptional errors that result from this process are unintentional.

## 2019-10-09 NOTE — ED Notes (Signed)
Pt provided with lunch.

## 2019-10-09 NOTE — Consult Note (Signed)
[facility] CONSULT NOTE    Date: 10/09/2019                  Patient Name:  Wayne Wilkerson  MRN: [id number]  DOB: 06/16/1971  Age / Sex: 49 y.o., male         PCP: Romualdo Bolk, FNP                 Service Requesting Consult:  Hospitalist                 Reason for Consult:  Acute kidney injury            History of Present Illness: Patient is a 49 y.o. male with a PMHx of COPD, hypertension, obstructive sleep apnea, peripheral neuropathy, pulmonary hypertension, glaucoma who was admitted to [facility] on 10/09/2019 for evaluation of abdominal pain, loose stools, and diminished urine output.  Patient reports that yesterday he felt he was not making very much urine.  His urine was dark in color as well.  He has had nausea over the past several days and his p.o. intake has not been as good as normal.  In addition he is noted to be on furosemide as well as spironolactone at home.  He denies taking NSAIDs at home.  Upon initial presentation he was found to have significant acute kidney injury with a BUN of 29 and creatinine of 5.1.  His most recent baseline creatinine we have from 02/28/2019 was 0.8 with an EGFR greater than 90.  Patient also had urinalysis performed which showed urine protein of 100 mg/dL but no hematuria.  Pyuria was noted.  He is undergoing IV fluid hydration at the moment.  Renal ultrasound was performed and was negative for hydronephrosis.   Medications: Outpatient medications: (Not in a hospital admission)   Current medications: Current Facility-Administered Medications  Medication Dose Route Frequency Provider Last Rate Last Admin  . 0.9 %  sodium chloride infusion   Intravenous Continuous Mansy, Arvella Merles, MD 100 mL/hr at 10/09/19 0723 New Bag at 10/09/19 0723  . acetaminophen (TYLENOL) tablet 650 mg  650 mg Oral Q6H PRN Mansy, Jan A, MD       Or  . acetaminophen (TYLENOL) suppository 650 mg  650 mg Rectal Q6H PRN Mansy, Jan A, MD      . alum &  mag hydroxide-simeth (MAALOX/MYLANTA) 200-200-20 MG/5ML suspension 30 mL  30 mL Oral Q4H PRN Mansy, Jan A, MD      . calcium carbonate (TUMS - dosed in mg elemental calcium) chewable tablet 400 mg of elemental calcium  2 tablet Oral TID WC Nicole Kindred A, DO   400 mg of elemental calcium at 10/09/19 1159  . heparin injection 5,000 Units  5,000 Units Subcutaneous Q8H Mansy, Jan A, MD      . latanoprost (XALATAN) 0.005 % ophthalmic solution 1 drop  1 drop Both Eyes QHS Mansy, Jan A, MD      . loratadine (CLARITIN) tablet 10 mg  10 mg Oral Daily Mansy, Jan A, MD   10 mg at 10/09/19 2725  . nicotine (NICODERM CQ - dosed in mg/24 hours) patch 21 mg  21 mg Transdermal Daily Nicole Kindred A, DO      . ondansetron (ZOFRAN) tablet 4 mg  4 mg Oral Q6H PRN Mansy, Jan A, MD       Or  . ondansetron [facility]) injection 4 mg  4 mg Intravenous Q6H PRN Mansy, Arvella Merles, MD      .  pantoprazole (PROTONIX) EC tablet 40 mg  40 mg Oral Daily Mansy, Jan A, MD   40 mg at 10/09/19 0956  . traZODone (DESYREL) tablet 25 mg  25 mg Oral QHS PRN Mansy, Arvella Merles, MD       Current Outpatient Medications  Medication Sig Dispense Refill  . albuterol (VENTOLIN HFA) 108 (90 Base) MCG/ACT inhaler Inhale 2 puffs into the lungs every 6 (six) hours as needed for shortness of breath or wheezing.    . furosemide (LASIX) 40 MG tablet Take 80 mg by mouth daily.    Marland Kitchen guaiFENesin (MUCINEX) 600 MG 12 hr tablet Take 600 mg by mouth 2 (two) times daily as needed for to loosen phlegm.    . latanoprost (XALATAN) 0.005 % ophthalmic solution Place 1 drop into both eyes at bedtime.     Marland Kitchen lisinopril (ZESTRIL) 20 MG tablet Take 20 mg by mouth daily.    Marland Kitchen loratadine (CLARITIN) 10 MG tablet Take 10 mg by mouth daily as needed (allergies).     . magnesium oxide (MAG-OX) 400 MG tablet Take 800 mg by mouth in the morning and at bedtime.    . nicotine (NICODERM CQ - DOSED IN MG/24 HOURS) 21 mg/24hr patch Place 21 mg onto the skin daily as needed for smoking  cessation.    Marland Kitchen SPIRIVA HANDIHALER 18 MCG inhalation capsule Place 18 mcg into inhaler and inhale daily.    Marland Kitchen spironolactone (ALDACTONE) 100 MG tablet Take 100 mg by mouth daily.    . Triamcinolone Acetonide (NASACORT AQ NA) Place 1-2 sprays into both nostrils daily as needed (allergies).         Allergies: No Known Allergies    Past Medical History: Past Medical History:  Diagnosis Date  . Bronchitis   . COPD (chronic obstructive pulmonary disease) ([place])   . Glaucoma   . Hypertension   . Neuropathy   . Pulmonary hypertension ([place])   . Sleep apnea      Past Surgical History: History reviewed. No pertinent surgical history.   Family History: No family history on file.   Social History: Social History   Socioeconomic History  . Marital status: Single    Spouse name: Not on file  . Number of children: Not on file  . Years of education: Not on file  . Highest education level: Not on file  Occupational History  . Not on file  Tobacco Use  . Smoking status: Current Every Day Smoker  . Smokeless tobacco: Never Used  Substance and Sexual Activity  . Alcohol use: Yes  . Drug use: No  . Sexual activity: Not on file  Other Topics Concern  . Not on file  Social History Narrative  . Not on file   Social Determinants of Health   Financial Resource Strain:   . Difficulty of Paying Living Expenses:   Food Insecurity:   . Worried About Charity fundraiser in the Last Year:   . Arboriculturist in the Last Year:   Transportation Needs:   . Film/video editor (Medical):   Marland Kitchen Lack of Transportation (Non-Medical):   Physical Activity:   . Days of Exercise per Week:   . Minutes of Exercise per Session:   Stress:   . Feeling of Stress :   Social Connections:   . Frequency of Communication with Friends and Family:   . Frequency of Social Gatherings with Friends and Family:   . Attends Religious Services:   . Active  Member of Clubs or Organizations:   . Attends  Archivist Meetings:   Marland Kitchen Marital Status:   Intimate Partner Violence:   . Fear of Current or Ex-Partner:   . Emotionally Abused:   Marland Kitchen Physically Abused:   . Sexually Abused:      Review of Systems: Review of Systems  Constitutional: Positive for malaise/fatigue. Negative for chills and fever.  HENT: Negative for congestion, hearing loss and tinnitus.   Eyes: Negative for blurred vision and double vision.  Respiratory: Negative for cough, sputum production and shortness of breath.   Cardiovascular: Negative for chest pain, palpitations and orthopnea.  Gastrointestinal: Positive for abdominal pain, diarrhea and nausea. Negative for vomiting.  Genitourinary: Negative for dysuria, frequency and urgency.  Musculoskeletal: Negative for myalgias.  Skin: Negative for itching and rash.  Neurological: Positive for weakness. Negative for dizziness and focal weakness.  Endo/Heme/Allergies: Negative for polydipsia. Does not bruise/bleed easily.  Psychiatric/Behavioral: The patient is not nervous/anxious.      Vital Signs: Blood pressure 124/80, pulse 80, temperature 97.9 F (36.6 C), temperature source Oral, resp. rate 19, height 6' 2"  (1.88 m), weight (!) 172.8 kg, SpO2 100 %.  Weight trends: Filed Weights   10/08/19 2146  Weight: (!) 172.8 kg    Physical Exam: General: NAD,   Head: Normocephalic, atraumatic.  Eyes: Anicteric, EOMI  Nose: Mucous membranes moist, not inflammed, nonerythematous.  Throat: Oropharynx nonerythematous, no exudate appreciated.   Neck: Supple, trachea midline.  Lungs:  Normal respiratory effort. Clear to auscultation BL without crackles or wheezes.  Heart: RRR. S1 and S2 normal without gallop, murmur, or rubs.  Abdomen:  BS normoactive. Soft, Nondistended, non-tender.  No masses or organomegaly.  Extremities: No pretibial edema.  Neurologic: A&O X3, Motor strength is 5/5 in the all 4 extremities  Skin: No visible rashes, scars.    Lab  results: Basic Metabolic Panel: Recent Labs  Lab 10/08/19 2155  NA 134*  K 3.7  CL 98  CO2 21*  GLUCOSE 116*  BUN 29*  CREATININE 5.11*  CALCIUM 8.8*    Liver Function Tests: Recent Labs  Lab 10/08/19 2155  AST 43*  ALT 42  ALKPHOS 84  BILITOT 0.6  PROT 7.4  ALBUMIN 4.2   Recent Labs  Lab 10/08/19 2155  LIPASE 29   No results for input(s): AMMONIA in the last 168 hours.  CBC: Recent Labs  Lab 10/08/19 2155  WBC 11.6*  NEUTROABS 7.2  HGB 14.0  HCT 42.1  MCV 102.2*  PLT 173    Cardiac Enzymes: No results for input(s): CKTOTAL, CKMB, CKMBINDEX, TROPONINI in the last 168 hours.  BNP: Invalid input(s): POCBNP  CBG: No results for input(s): GLUCAP in the last 168 hours.  Microbiology: Results for orders placed or performed during the hospital encounter of 10/09/19  Respiratory Panel by RT PCR (Flu A&B, Covid) - Nasopharyngeal Swab     Status: None   Collection Time: 10/09/19  6:48 AM   Specimen: Nasopharyngeal Swab  Result Value Ref Range Status   SARS Coronavirus 2 by RT PCR NEGATIVE NEGATIVE Final    Comment: (NOTE) SARS-CoV-2 target nucleic acids are NOT DETECTED. The SARS-CoV-2 RNA is generally detectable in upper respiratoy specimens during the acute phase of infection. The lowest concentration of SARS-CoV-2 viral copies this assay can detect is 131 copies/mL. A negative result does not preclude SARS-Cov-2 infection and should not be used as the sole basis for treatment or other patient management decisions. A negative result  may occur with  improper specimen collection/handling, submission of specimen other than nasopharyngeal swab, presence of viral mutation(s) within the areas targeted by this assay, and inadequate number of viral copies (<131 copies/mL). A negative result must be combined with clinical observations, patient history, and epidemiological information. The expected result is Negative. Fact Sheet for Patients:   PinkCheek.be Fact Sheet for Healthcare Providers:  GravelBags.it This test is not yet ap proved or cleared by the [place] FDA and  has been authorized for detection and/or diagnosis of SARS-CoV-2 by FDA under an Emergency Use Authorization (EUA). This EUA will remain  in effect (meaning this test can be used) for the duration of the COVID-19 declaration under Section 564(b)(1) of the Act, 21 U.S.C. section 360bbb-3(b)(1), unless the authorization is terminated or revoked sooner.    Influenza A by PCR NEGATIVE NEGATIVE Final   Influenza B by PCR NEGATIVE NEGATIVE Final    Comment: (NOTE) The Xpert Xpress SARS-CoV-2/FLU/RSV assay is intended as an aid in  the diagnosis of influenza from Nasopharyngeal swab specimens and  should not be used as a sole basis for treatment. Nasal washings and  aspirates are unacceptable for Xpert Xpress SARS-CoV-2/FLU/RSV  testing. Fact Sheet for Patients: PinkCheek.be Fact Sheet for Healthcare Providers: GravelBags.it This test is not yet approved or cleared by the [place] FDA and  has been authorized for detection and/or diagnosis of SARS-CoV-2 by  FDA under an Emergency Use Authorization (EUA). This EUA will remain  in effect (meaning this test can be used) for the duration of the  Covid-19 declaration under Section 564(b)(1) of the Act, 21  U.S.C. section 360bbb-3(b)(1), unless the authorization is  terminated or revoked. Performed at [facility], [address] [place], [place] [address]     Coagulation Studies: No results for input(s): LABPROT, INR in the last 72 hours.  Urinalysis: Recent Labs    10/08/19 2155  COLORURINE AMBER*  LABSPEC 1.024  PHURINE 5.0  GLUCOSEU NEGATIVE  HGBUR NEGATIVE  BILIRUBINUR SMALL*  KETONESUR 5*  PROTEINUR 100*  NITRITE NEGATIVE  LEUKOCYTESUR TRACE*       Imaging: US RENAL  Result Date: 10/09/2019 CLINICAL DATA:  Acute renal injury. EXAM: RENAL / URINARY TRACT ULTRASOUND COMPLETE COMPARISON:  CT scan 10/09/2019 FINDINGS: Right Kidney: Renal measurements: 10.8 x 5.7 x 6.9 cm = volume: 223.4 mL. Normal renal cortical thickness and echogenicity without focal lesions or hydronephrosis. Left Kidney: Renal measurements: 12.8 x 7.1 x 6.1 cm = volume: Inter 290.7 mL. Normal renal cortical thickness and echogenicity without focal lesions or hydronephrosis. Bladder: Appears normal for degree of bladder distention. Other: None. IMPRESSION: 1. Limited examination due to body habitus and poor sonographic window. 2. No renal lesions or hydronephrosis. Electronically Signed   By: Marijo Sanes M.D.   On: 10/09/2019 08:01   CT Renal Stone Study  Result Date: 10/09/2019 CLINICAL DATA:  Flank pain. Kidney stone suspected. Left-sided pain. Difficulty urinating. EXAM: CT ABDOMEN AND PELVIS WITHOUT CONTRAST TECHNIQUE: Multidetector CT imaging of the abdomen and pelvis was performed following the standard protocol without IV contrast. COMPARISON:  None. FINDINGS: Lower chest: Lung bases are clear without focal nodule, mass, or airspace disease. The heart size is normal. No significant pleural or pericardial effusion is present. Hepatobiliary: Hepatic steatosis is evident. No discrete lesions are present. The common bile duct and gallbladder scratched at density in the gallbladder likely represents sludge. No discrete stone is evident. Inflammatory changes present. Pancreas: Unremarkable. No pancreatic ductal dilatation or  surrounding inflammatory changes. Spleen: Normal in size without focal abnormality. Adrenals/Urinary Tract: Adrenal glands are normal bilaterally. Kidneys and ureters are within normal limits. No stone or mass lesion is present. The urinary bladder is within normal limits. Stomach/Bowel: Stomach and duodenum are within normal limits. Small bowel is  unremarkable. Terminal ileum is within normal limits. Appendix is visualized and normal. The ascending and transverse colon are normal. Descending colon is unremarkable. Diverticular changes are present throughout the sigmoid colon. No focal inflammation is evident to suggest diverticulitis. Vascular/Lymphatic: Atherosclerotic changes are present in the aorta branch vessels without aneurysm. No significant retroperitoneal adenopathy is present. Reproductive: Prostate is unremarkable. Other: No abdominal wall hernia or abnormality. No abdominopelvic ascites. Musculoskeletal: Vacuum disc is present at L5-S1. Degenerative changes contribute to central and foraminal narrowing at L4-5 and L5-S1. Vertebral body heights are maintained. No focal lytic or blastic lesions are present. Bony pelvis is normal. Hips are located and within normal limits. IMPRESSION: 1. No acute or focal lesion to explain the patient's symptoms. 2. Hepatic steatosis. 3. Sigmoid diverticulosis without diverticulitis. 4. Degenerative changes in the lower lumbar spine. 5. Aortic Atherosclerosis (ICD10-I70.0). Electronically Signed   By: San Morelle M.D.   On: 10/09/2019 04:57      Assessment & Plan: Pt is a 49 y.o. male with a PMHx of COPD, hypertension, obstructive sleep apnea, peripheral neuropathy, pulmonary hypertension, glaucoma who was admitted to [facility] on 10/09/2019 for evaluation of abdominal pain, loose stools, and diminished urine output.  1.  Acute kidney injury with baseline creatinine of 0.8 on 02/28/2019.  Suspect that acute kidney injury is multifactorial with contributions from nausea, poor intake, diarrhea, and use of diuretics in the form of furosemide and spironolactone.  Agree with IV fluid hydration.  Renal ultrasound was negative for hydronephrosis.  Proceed with additional work-up including SPEP, UPEP, ANA, ANCA antibodies, GBM antibodies, C3, C4, and urine protein to creatinine ratio.  Avoid nephrotoxins as  possible over the course of the admission.  No urgent indication for dialysis at the moment.  Further plan as patient progresses.  2.  Hyponatremia.  Suspect hypovolemia in nature.  Continue IV fluid hydration with 0.9 normal saline.

## 2019-10-09 NOTE — Progress Notes (Signed)
  PROGRESS NOTE    Wayne Wilkerson  DUK:[id number] DOB: 08-May-1971 DOA: 10/09/2019  PCP: Olena Leatherwood, FNP    LOS - 0    Patient admitted earlier this AM with acute renal failure, likely secondary to pre-renal azotemia in the setting of diarrhea and poor PO intake.  Interval subjective: patient feeling a little better, making more urine again.  No fever chills or other acute complaints   I have reviewed the full H&P by Dr. Arville Care in detail, and I agree with the assessment and plan as outlined therein.   No Charge    Pennie Banter, DO [facility]   If 7PM-7AM, please contact night-coverage [email or web address] 10/09/2019, 9:03 AM

## 2019-10-09 NOTE — ED Notes (Signed)
MD made aware of BP. Pt reports hx of HTN but has been taking medications and has noted, "my blood pressure has been lower but not too low."   Pt denies dizziness or lightheadedness. Pt reports one episode for "a couple of seconds" yesterday where his vision became blurred and "dim" but it quickly subsided. Pt reporting generalized fatigue and weakness.

## 2019-10-09 NOTE — ED Notes (Signed)
Pt given warm blanket.

## 2019-10-10 DIAGNOSIS — A0472 Enterocolitis due to Clostridium difficile, not specified as recurrent: Secondary | ICD-10-CM | POA: Diagnosis present

## 2019-10-10 LAB — BASIC METABOLIC PANEL
Anion gap: 11 (ref 5–15)
BUN: 34 mg/dL — ABNORMAL HIGH (ref 6–20)
CO2: 25 mmol/L (ref 22–32)
Calcium: 8.5 mg/dL — ABNORMAL LOW (ref 8.9–10.3)
Chloride: 102 mmol/L (ref 98–111)
Creatinine, Ser: 2.4 mg/dL — ABNORMAL HIGH (ref 0.61–1.24)
GFR calc Af Amer: 35 mL/min — ABNORMAL LOW (ref >60)
GFR calc non Af Amer: 31 mL/min — ABNORMAL LOW (ref >60)
Glucose, Bld: 101 mg/dL — ABNORMAL HIGH (ref 70–99)
Potassium: 4 mmol/L (ref 3.5–5.1)
Sodium: 138 mmol/L (ref 135–145)

## 2019-10-10 LAB — CBC
HCT: 36.1 % — ABNORMAL LOW (ref 39.0–52.0)
Hemoglobin: 12.1 g/dL — ABNORMAL LOW (ref 13.0–17.0)
MCH: 34.3 pg — ABNORMAL HIGH (ref 26.0–34.0)
MCHC: 33.5 g/dL (ref 30.0–36.0)
MCV: 102.3 fL — ABNORMAL HIGH (ref 80.0–100.0)
Platelets: 138 10*3/uL — ABNORMAL LOW (ref 150–400)
RBC: 3.53 MIL/uL — ABNORMAL LOW (ref 4.22–5.81)
RDW: 14.6 % (ref 11.5–15.5)
WBC: 6.3 10*3/uL (ref 4.0–10.5)
nRBC: 0 % (ref 0.0–0.2)

## 2019-10-10 LAB — C DIFFICILE QUICK SCREEN W PCR REFLEX
C Diff antigen: POSITIVE — AB
C Diff toxin: NEGATIVE

## 2019-10-10 LAB — C3 COMPLEMENT: C3 Complement: 134 mg/dL (ref 82–167)

## 2019-10-10 LAB — CLOSTRIDIUM DIFFICILE BY PCR, REFLEXED: Toxigenic C. Difficile by PCR: POSITIVE — AB

## 2019-10-10 LAB — C4 COMPLEMENT: Complement C4, Body Fluid: 28 mg/dL (ref 12–38)

## 2019-10-10 LAB — ANA W/REFLEX IF POSITIVE: Anti Nuclear Antibody (ANA): NEGATIVE

## 2019-10-10 LAB — GLOMERULAR BASEMENT MEMBRANE ANTIBODIES: GBM Ab: 3 units (ref 0–20)

## 2019-10-10 MED ORDER — ONDANSETRON HCL 4 MG PO TABS: 4.0000 mg | ORAL_TABLET | Freq: Four times a day (QID) | ORAL | 0 refills | Status: AC | PRN

## 2019-10-10 MED ORDER — VANCOMYCIN HCL 125 MG PO CAPS: 125.0000 mg | ORAL_CAPSULE | Freq: Four times a day (QID) | ORAL | 0 refills | Status: AC

## 2019-10-10 MED ORDER — VANCOMYCIN 50 MG/ML ORAL SOLUTION
125.0000 mg | Freq: Four times a day (QID) | ORAL | Status: DC
  Administered 2019-10-10 (×2): 125 mg via ORAL
  Filled 2019-10-10 (×4): qty 2.5

## 2019-10-10 NOTE — Discharge Summary (Signed)
Physician Discharge Summary  Wayne Wilkerson QMV:[id number] DOB: June 27, 1970 DOA: 10/09/2019  PCP: Olena Leatherwood, FNP  Admit date: 10/09/2019 Discharge date: 10/10/2019  Admitted From: home Disposition:  home  Recommendations for Outpatient Follow-up:  1. Follow up with PCP in 1-2 weeks 2. Please obtain BMP/CBC in one week 3. Patient's Lasix, lisinopril and spironolactone were held due to AKI.   Please resume these as soon as possible when appropriate.  Home Health: no Equipment/Devices: none   Discharge Condition: stable  CODE STATUS: full  Diet recommendation: Heart Healthy / Carb Modified / Regular / Dysphagia   Brief/Interim Summary:  Wayne Wilkerson  is a 49 y.o. morbidly obese African-American male with a known history of COPD, hypertension, peripheral neuropathy and obstructive sleep apnea, who presented to the ED with left-sided abdominal pain and recent diminished urine output over the last 3 days.  Also reported slightly worsening diarrhea, nausea without vomiting and intermittent dizziness.  No fevers or chills.  In the ED, BP was 100/56, HR 110, other vitals normal.  Labs notable for BUN 29 and creatitine 5.11 (unknown baseline), mild hyponatremia, AST 43.  CBC showed mild leukocytosis.  UA showed 11-20 WBCs, hyaline casts 100 protein and 5 ketones.  CT abdomen/pelvis showed sigmoid diverticulosis without diverticulitis, degenerative changes in the lower lumbar spine, aortic atherosclerosis, hepatic steatosis with no acute or focal lesion to explain the patient's symptoms.  Treated with IV fluids and antiemetics in the ED.  Admitted to hospitalist service for further evaluation and management.  Diarrhea secondary to C. Difficile Infection - patient will be treated with 10 day course of oral vancomycin for first episode of C diff infection.  Now having formed stools.  Acute kidney injury - pre-renal azotemia in setting of diarrhea, dehydration, C diff infection.  Improved significantly  after 24 hours IV hydration. Urine output much better today. --Held Lasix, lisinopril and spironolactone on discharge --close PCP follow up for repeat BMP and resume above meds --avoid other nephrotoxic meds, dehydration, hypotension  Abdominal Pain, Nausea - resolved, due to C diff infection --PRN Zofran    Discharge Diagnoses: Active Problems:   C. difficile diarrhea   AKI (acute kidney injury) [facility])    Discharge Instructions   Discharge Instructions    Call MD for:   Complete by: As directed    Not making very much urine   Call MD for:  extreme fatigue   Complete by: As directed    Call MD for:  persistant dizziness or light-headedness   Complete by: As directed    Call MD for:  temperature >100.4   Complete by: As directed    Diet - low sodium heart healthy   Complete by: As directed    Discharge instructions   Complete by: As directed    Take antibiotic (Vancomycin) 4 times daily for 10 days.   C diff infections can sometimes recur, so it is VERY important that you take the medicine for all 10 days.  Please stay well-hydrated, drink plenty of fluids. Please see Primary Care for repeat labs and make sure kidney function returned to normal.  You should hold off on taking the Lasix, lisinopril and spironolactone for another 3-5 days.   Ideally, you should resume them after labs are repeated to check kidney function and electrolytes.   If you are unable to get in to see primary care for labs, then resume those medicines in 5 days (on 4/28).   Increase activity slowly   Complete  by: As directed      Allergies as of 10/10/2019   No Known Allergies     Medication List    STOP taking these medications   furosemide 40 MG tablet Commonly known as: LASIX   lisinopril 20 MG tablet Commonly known as: ZESTRIL   spironolactone 100 MG tablet Commonly known as: ALDACTONE     TAKE these medications   albuterol 108 (90 Base) MCG/ACT inhaler Commonly known as: VENTOLIN  HFA Inhale 2 puffs into the lungs every 6 (six) hours as needed for shortness of breath or wheezing.   Claritin 10 MG tablet Generic drug: loratadine Take 10 mg by mouth daily as needed (allergies).   guaiFENesin 600 MG 12 hr tablet Commonly known as: MUCINEX Take 600 mg by mouth 2 (two) times daily as needed for to loosen phlegm.   latanoprost 0.005 % ophthalmic solution Commonly known as: XALATAN Place 1 drop into both eyes at bedtime.   magnesium oxide 400 MG tablet Commonly known as: MAG-OX Take 800 mg by mouth in the morning and at bedtime.   NASACORT AQ NA Place 1-2 sprays into both nostrils daily as needed (allergies).   nicotine 21 mg/24hr patch Commonly known as: NICODERM CQ - dosed in mg/24 hours Place 21 mg onto the skin daily as needed for smoking cessation.   ondansetron 4 MG tablet Commonly known as: ZOFRAN Take 1 tablet (4 mg total) by mouth every 6 (six) hours as needed for nausea.   Spiriva HandiHaler 18 MCG inhalation capsule Generic drug: tiotropium Place 18 mcg into inhaler and inhale daily.   vancomycin 125 MG capsule Commonly known as: VANCOCIN Take 1 capsule (125 mg total) by mouth 4 (four) times daily for 10 days.       No Known Allergies  Consultations:  Nephrology    Procedures/Studies: US RENAL  Result Date: 10/09/2019 CLINICAL DATA:  Acute renal injury. EXAM: RENAL / URINARY TRACT ULTRASOUND COMPLETE COMPARISON:  CT scan 10/09/2019 FINDINGS: Right Kidney: Renal measurements: 10.8 x 5.7 x 6.9 cm = volume: 223.4 mL. Normal renal cortical thickness and echogenicity without focal lesions or hydronephrosis. Left Kidney: Renal measurements: 12.8 x 7.1 x 6.1 cm = volume: Inter 290.7 mL. Normal renal cortical thickness and echogenicity without focal lesions or hydronephrosis. Bladder: Appears normal for degree of bladder distention. Other: None. IMPRESSION: 1. Limited examination due to body habitus and poor sonographic window. 2. No renal  lesions or hydronephrosis. Electronically Signed   By: Marijo Sanes M.D.   On: 10/09/2019 08:01   CT Renal Stone Study  Result Date: 10/09/2019 CLINICAL DATA:  Flank pain. Kidney stone suspected. Left-sided pain. Difficulty urinating. EXAM: CT ABDOMEN AND PELVIS WITHOUT CONTRAST TECHNIQUE: Multidetector CT imaging of the abdomen and pelvis was performed following the standard protocol without IV contrast. COMPARISON:  None. FINDINGS: Lower chest: Lung bases are clear without focal nodule, mass, or airspace disease. The heart size is normal. No significant pleural or pericardial effusion is present. Hepatobiliary: Hepatic steatosis is evident. No discrete lesions are present. The common bile duct and gallbladder scratched at density in the gallbladder likely represents sludge. No discrete stone is evident. Inflammatory changes present. Pancreas: Unremarkable. No pancreatic ductal dilatation or surrounding inflammatory changes. Spleen: Normal in size without focal abnormality. Adrenals/Urinary Tract: Adrenal glands are normal bilaterally. Kidneys and ureters are within normal limits. No stone or mass lesion is present. The urinary bladder is within normal limits. Stomach/Bowel: Stomach and duodenum are within normal limits. Small bowel is  unremarkable. Terminal ileum is within normal limits. Appendix is visualized and normal. The ascending and transverse colon are normal. Descending colon is unremarkable. Diverticular changes are present throughout the sigmoid colon. No focal inflammation is evident to suggest diverticulitis. Vascular/Lymphatic: Atherosclerotic changes are present in the aorta branch vessels without aneurysm. No significant retroperitoneal adenopathy is present. Reproductive: Prostate is unremarkable. Other: No abdominal wall hernia or abnormality. No abdominopelvic ascites. Musculoskeletal: Vacuum disc is present at L5-S1. Degenerative changes contribute to central and foraminal narrowing at  L4-5 and L5-S1. Vertebral body heights are maintained. No focal lytic or blastic lesions are present. Bony pelvis is normal. Hips are located and within normal limits. IMPRESSION: 1. No acute or focal lesion to explain the patient's symptoms. 2. Hepatic steatosis. 3. Sigmoid diverticulosis without diverticulitis. 4. Degenerative changes in the lower lumbar spine. 5. Aortic Atherosclerosis (ICD10-I70.0). Electronically Signed   By: Marin Robertshristopher  Mattern M.D.   On: 10/09/2019 04:57       Subjective: Patient seen at bedside.  Reports feeling better.  No nausea or abdominal pain.  No more diarrhea, had a formed stool today.  No fever chills or other complaints.  He expressed concern of cost to see his PCP for follow up labs.   Discharge Exam: Vitals:   10/09/19 2241 10/10/19 0509  BP: 107/70 124/68  Pulse: 78 73  Resp: 20 (!) 24  Temp: 97.7 F (36.5 C) 97.6 F (36.4 C)  SpO2: 96% 99%   Vitals:   10/09/19 1130 10/09/19 1556 10/09/19 2241 10/10/19 0509  BP: 124/80 112/66 107/70 124/68  Pulse: 80 (!) 122 78 73  Resp: 19 20 20  (!) 24  Temp:   97.7 F (36.5 C) 97.6 F (36.4 C)  TempSrc:   Oral Oral  SpO2: 100% 94% 96% 99%  Weight:      Height:        General: Pt is alert, awake, not in acute distress, morbidly obese Cardiovascular: RRR, S1/S2 +, no rubs, no gallops Respiratory: CTA bilaterally, no wheezing, no rhonchi Abdominal: Soft, NT, ND, bowel sounds + Extremities: no edema, no cyanosis    The results of significant diagnostics from this hospitalization (including imaging, microbiology, ancillary and laboratory) are listed below for reference.     Microbiology: Recent Results (from the past 240 hour(s))  Respiratory Panel by RT PCR (Flu A&B, Covid) - Nasopharyngeal Swab     Status: None   Collection Time: 10/09/19  6:48 AM   Specimen: Nasopharyngeal Swab  Result Value Ref Range Status   SARS Coronavirus 2 by RT PCR NEGATIVE NEGATIVE Final    Comment: (NOTE) SARS-CoV-2  target nucleic acids are NOT DETECTED. The SARS-CoV-2 RNA is generally detectable in upper respiratoy specimens during the acute phase of infection. The lowest concentration of SARS-CoV-2 viral copies this assay can detect is 131 copies/mL. A negative result does not preclude SARS-Cov-2 infection and should not be used as the sole basis for treatment or other patient management decisions. A negative result may occur with  improper specimen collection/handling, submission of specimen other than nasopharyngeal swab, presence of viral mutation(s) within the areas targeted by this assay, and inadequate number of viral copies (<131 copies/mL). A negative result must be combined with clinical observations, patient history, and epidemiological information. The expected result is Negative. Fact Sheet for Patients:  https://www.moore.com/https://www.fda.gov/media/142436/download Fact Sheet for Healthcare Providers:  https://www.young.biz/https://www.fda.gov/media/142435/download This test is not yet ap proved or cleared by the [place] FDA and  has been authorized for detection and/or diagnosis  of SARS-CoV-2 by FDA under an Emergency Use Authorization (EUA). This EUA will remain  in effect (meaning this test can be used) for the duration of the COVID-19 declaration under Section 564(b)(1) of the Act, 21 U.S.C. section 360bbb-3(b)(1), unless the authorization is terminated or revoked sooner.    Influenza A by PCR NEGATIVE NEGATIVE Final   Influenza B by PCR NEGATIVE NEGATIVE Final    Comment: (NOTE) The Xpert Xpress SARS-CoV-2/FLU/RSV assay is intended as an aid in  the diagnosis of influenza from Nasopharyngeal swab specimens and  should not be used as a sole basis for treatment. Nasal washings and  aspirates are unacceptable for Xpert Xpress SARS-CoV-2/FLU/RSV  testing. Fact Sheet for Patients: https://www.moore.com/ Fact Sheet for Healthcare Providers: https://www.young.biz/ This test is  not yet approved or cleared by the [place] FDA and  has been authorized for detection and/or diagnosis of SARS-CoV-2 by  FDA under an Emergency Use Authorization (EUA). This EUA will remain  in effect (meaning this test can be used) for the duration of the  Covid-19 declaration under Section 564(b)(1) of the Act, 21  U.S.C. section 360bbb-3(b)(1), unless the authorization is  terminated or revoked. Performed at [facility], [address] [place], [place] [address]   C Difficile Quick Screen w PCR reflex     Status: Abnormal   Collection Time: 10/09/19 10:40 PM   Specimen: STOOL  Result Value Ref Range Status   C Diff antigen POSITIVE (A) NEGATIVE Final   C Diff toxin NEGATIVE NEGATIVE Final   C Diff interpretation Results are indeterminate. See PCR results.  Final    Comment: Performed at [facility], [address] [place], [place] [address]  C. Diff by PCR, Reflexed     Status: Abnormal   Collection Time: 10/09/19 10:40 PM  Result Value Ref Range Status   Toxigenic C. Difficile by PCR POSITIVE (A) NEGATIVE Final    Comment: Positive for toxigenic C. difficile with little to no toxin production. Only treat if clinical presentation suggests symptomatic illness. Performed at [facility], [address] [place], [place] [address]      Labs: BNP (last 3 results) No results for input(s): BNP in the last 8760 hours. Basic Metabolic Panel: Recent Labs  Lab 10/08/19 2155 10/10/19 0408  NA 134* 138  K 3.7 4.0  CL 98 102  CO2 21* 25  GLUCOSE 116* 101*  BUN 29* 34*  CREATININE 5.11* 2.40*  CALCIUM 8.8* 8.5*   Liver Function Tests: Recent Labs  Lab 10/08/19 2155  AST 43*  ALT 42  ALKPHOS 84  BILITOT 0.6  PROT 7.4  ALBUMIN 4.2   Recent Labs  Lab 10/08/19 2155  LIPASE 29   No results for input(s): AMMONIA in the last 168 hours. CBC: Recent Labs  Lab 10/08/19 2155 10/10/19 0408  WBC 11.6* 6.3  NEUTROABS 7.2  --   HGB  14.0 12.1*  HCT 42.1 36.1*  MCV 102.2* 102.3*  PLT 173 138*   Cardiac Enzymes: No results for input(s): CKTOTAL, CKMB, CKMBINDEX, TROPONINI in the last 168 hours. BNP: Invalid input(s): POCBNP CBG: No results for input(s): GLUCAP in the last 168 hours. D-Dimer No results for input(s): DDIMER in the last 72 hours. Hgb A1c No results for input(s): HGBA1C in the last 72 hours. Lipid Profile No results for input(s): CHOL, HDL, LDLCALC, TRIG, CHOLHDL, LDLDIRECT in the last 72 hours. Thyroid function studies No results for input(s): TSH, T4TOTAL, T3FREE, THYROIDAB in the last  72 hours.  Invalid input(s): FREET3 Anemia work up No results for input(s): VITAMINB12, FOLATE, FERRITIN, TIBC, IRON, RETICCTPCT in the last 72 hours. Urinalysis    Component Value Date/Time   COLORURINE AMBER (A) 10/08/2019 2155   APPEARANCEUR CLOUDY (A) 10/08/2019 2155   LABSPEC 1.024 10/08/2019 2155   PHURINE 5.0 10/08/2019 2155   GLUCOSEU NEGATIVE 10/08/2019 2155   HGBUR NEGATIVE 10/08/2019 2155   BILIRUBINUR SMALL (A) 10/08/2019 2155   KETONESUR 5 (A) 10/08/2019 2155   PROTEINUR 100 (A) 10/08/2019 2155   NITRITE NEGATIVE 10/08/2019 2155   LEUKOCYTESUR TRACE (A) 10/08/2019 2155   Sepsis Labs Invalid input(s): PROCALCITONIN,  WBC,  LACTICIDVEN Microbiology Recent Results (from the past 240 hour(s))  Respiratory Panel by RT PCR (Flu A&B, Covid) - Nasopharyngeal Swab     Status: None   Collection Time: 10/09/19  6:48 AM   Specimen: Nasopharyngeal Swab  Result Value Ref Range Status   SARS Coronavirus 2 by RT PCR NEGATIVE NEGATIVE Final    Comment: (NOTE) SARS-CoV-2 target nucleic acids are NOT DETECTED. The SARS-CoV-2 RNA is generally detectable in upper respiratoy specimens during the acute phase of infection. The lowest concentration of SARS-CoV-2 viral copies this assay can detect is 131 copies/mL. A negative result does not preclude SARS-Cov-2 infection and should not be used as the sole basis  for treatment or other patient management decisions. A negative result may occur with  improper specimen collection/handling, submission of specimen other than nasopharyngeal swab, presence of viral mutation(s) within the areas targeted by this assay, and inadequate number of viral copies (<131 copies/mL). A negative result must be combined with clinical observations, patient history, and epidemiological information. The expected result is Negative. Fact Sheet for Patients:  https://www.moore.com/ Fact Sheet for Healthcare Providers:  https://www.young.biz/ This test is not yet ap proved or cleared by the [place] FDA and  has been authorized for detection and/or diagnosis of SARS-CoV-2 by FDA under an Emergency Use Authorization (EUA). This EUA will remain  in effect (meaning this test can be used) for the duration of the COVID-19 declaration under Section 564(b)(1) of the Act, 21 U.S.C. section 360bbb-3(b)(1), unless the authorization is terminated or revoked sooner.    Influenza A by PCR NEGATIVE NEGATIVE Final   Influenza B by PCR NEGATIVE NEGATIVE Final    Comment: (NOTE) The Xpert Xpress SARS-CoV-2/FLU/RSV assay is intended as an aid in  the diagnosis of influenza from Nasopharyngeal swab specimens and  should not be used as a sole basis for treatment. Nasal washings and  aspirates are unacceptable for Xpert Xpress SARS-CoV-2/FLU/RSV  testing. Fact Sheet for Patients: https://www.moore.com/ Fact Sheet for Healthcare Providers: https://www.young.biz/ This test is not yet approved or cleared by the [place] FDA and  has been authorized for detection and/or diagnosis of SARS-CoV-2 by  FDA under an Emergency Use Authorization (EUA). This EUA will remain  in effect (meaning this test can be used) for the duration of the  Covid-19 declaration under Section 564(b)(1) of the Act, 21  U.S.C.  section 360bbb-3(b)(1), unless the authorization is  terminated or revoked. Performed at [facility], [address] [place], [place] [address]   C Difficile Quick Screen w PCR reflex     Status: Abnormal   Collection Time: 10/09/19 10:40 PM   Specimen: STOOL  Result Value Ref Range Status   C Diff antigen POSITIVE (A) NEGATIVE Final   C Diff toxin NEGATIVE NEGATIVE Final   C Diff interpretation Results are indeterminate. See  PCR results.  Final    Comment: Performed at [facility], [address] [place], [place] [address]  C. Diff by PCR, Reflexed     Status: Abnormal   Collection Time: 10/09/19 10:40 PM  Result Value Ref Range Status   Toxigenic C. Difficile by PCR POSITIVE (A) NEGATIVE Final    Comment: Positive for toxigenic C. difficile with little to no toxin production. Only treat if clinical presentation suggests symptomatic illness. Performed at [facility], [address] [place], [place] [address]      Time coordinating discharge: Over 30 minutes  SIGNED:   Pennie Banter, DO [facility] 10/10/2019, 1:32 PM   If 7PM-7AM, please contact night-coverage [email or web address]

## 2019-10-10 NOTE — TOC Progression Note (Addendum)
Transition of Care [facility]) - Progression Note    Patient Details  Name: Wayne Wilkerson MRN: [id number] Date of Birth: [birth year]  [facility] CM/SW Contact  Erin Sons, [place] Phone Number: 10/10/2019, 11:09 AM  Clinical Narrative:     CSW calls pt on room phone. Pt. Has rapid speech and is difficult to understand at times. CSW inquires about PCP needs. Pt. Explains he would like to try to get a new PCP to get a different opinion on his health. Pt. Is established with Gillian Shields at [facility] and saw PCP as recently as last week. Pt. Explains he has a list of PCP's from his insurance though is unclear if he wants CSW to establish appointment even when asked directly. CSW offers to set up hospital follow up with Dr. Daleen Squibb which pt does not want. CSW also explains that pt wouldn't be able to receive med assistance due to having insurance and provided [employer] as a potential resource. Pt. Then asked about various income and employment programs. CSW explained that no income or employment programs exist through the hospital though offered to provide community resources that could assist with income and employment.   1330: CSW put resource list in pts discharge folder. Informed pt over the phone. Also clarified whether or not pt wants CSW to establish new PCP. Pt explains he will do on his own as he has a list of approved providers from his insurance.   TOC sign off.        Expected Discharge Plan and Services                                                 Social Determinants of Health (SDOH) Interventions    Readmission Risk Interventions No flowsheet data found.

## 2019-10-10 NOTE — Progress Notes (Signed)
[facility]  ROUNDING NOTE   Subjective:  It appears that the patient's renal function has significantly improved. Creatinine down to 2.4. Serum sodium also up to 138. Renal ultrasound reviewed with no obvious hydronephrosis. Patient also found to be C. difficile positive.  Objective:  Vital signs in last 24 hours:  Temp:  [97.6 F (36.4 C)-97.7 F (36.5 C)] 97.6 F (36.4 C) (04/23 0509) Pulse Rate:  [73-122] 73 (04/23 0509) Resp:  [20-24] 24 (04/23 0509) BP: (107-124)/(66-70) 124/68 (04/23 0509) SpO2:  [94 %-99 %] 99 % (04/23 0509)  Weight change:  Filed Weights   10/08/19 2146  Weight: (!) 172.8 kg    Intake/Output: I/O last 3 completed shifts: In: 1893.7 [P.O.:480; I.V.:413.7; IV Piggyback:1000] Out: 500 [Urine:500]   Intake/Output this shift:  No intake/output data recorded.  Physical Exam: General: No acute distress  Head: Normocephalic, atraumatic. Moist oral mucosal membranes  Eyes: Anicteric  Neck: Supple, trachea midline  Lungs:  Clear to auscultation, normal effort  Heart: S1S2 no rubs  Abdomen:  Soft, nontender, bowel sounds present  Extremities: Trace peripheral edema.  Neurologic: Awake, alert, following commands  Skin: No lesions       Basic Metabolic Panel: Recent Labs  Lab 10/08/19 2155 10/10/19 0408  NA 134* 138  K 3.7 4.0  CL 98 102  CO2 21* 25  GLUCOSE 116* 101*  BUN 29* 34*  CREATININE 5.11* 2.40*  CALCIUM 8.8* 8.5*    Liver Function Tests: Recent Labs  Lab 10/08/19 2155  AST 43*  ALT 42  ALKPHOS 84  BILITOT 0.6  PROT 7.4  ALBUMIN 4.2   Recent Labs  Lab 10/08/19 2155  LIPASE 29   No results for input(s): AMMONIA in the last 168 hours.  CBC: Recent Labs  Lab 10/08/19 2155 10/10/19 0408  WBC 11.6* 6.3  NEUTROABS 7.2  --   HGB 14.0 12.1*  HCT 42.1 36.1*  MCV 102.2* 102.3*  PLT 173 138*    Cardiac Enzymes: No results for input(s): CKTOTAL, CKMB, CKMBINDEX, TROPONINI in the last 168  hours.  BNP: Invalid input(s): POCBNP  CBG: No results for input(s): GLUCAP in the last 168 hours.  Microbiology: Results for orders placed or performed during the hospital encounter of 10/09/19  Respiratory Panel by RT PCR (Flu A&B, Covid) - Nasopharyngeal Swab     Status: None   Collection Time: 10/09/19  6:48 AM   Specimen: Nasopharyngeal Swab  Result Value Ref Range Status   SARS Coronavirus 2 by RT PCR NEGATIVE NEGATIVE Final    Comment: (NOTE) SARS-CoV-2 target nucleic acids are NOT DETECTED. The SARS-CoV-2 RNA is generally detectable in upper respiratoy specimens during the acute phase of infection. The lowest concentration of SARS-CoV-2 viral copies this assay can detect is 131 copies/mL. A negative result does not preclude SARS-Cov-2 infection and should not be used as the sole basis for treatment or other patient management decisions. A negative result may occur with  improper specimen collection/handling, submission of specimen other than nasopharyngeal swab, presence of viral mutation(s) within the areas targeted by this assay, and inadequate number of viral copies (<131 copies/mL). A negative result must be combined with clinical observations, patient history, and epidemiological information. The expected result is Negative. Fact Sheet for Patients:  PinkCheek.be Fact Sheet for Healthcare Providers:  GravelBags.it This test is not yet ap proved or cleared by the [place] FDA and  has been authorized for detection and/or diagnosis of SARS-CoV-2 by FDA under an Emergency Use  Authorization (EUA). This EUA will remain  in effect (meaning this test can be used) for the duration of the COVID-19 declaration under Section 564(b)(1) of the Act, 21 U.S.C. section 360bbb-3(b)(1), unless the authorization is terminated or revoked sooner.    Influenza A by PCR NEGATIVE NEGATIVE Final   Influenza B by PCR NEGATIVE  NEGATIVE Final    Comment: (NOTE) The Xpert Xpress SARS-CoV-2/FLU/RSV assay is intended as an aid in  the diagnosis of influenza from Nasopharyngeal swab specimens and  should not be used as a sole basis for treatment. Nasal washings and  aspirates are unacceptable for Xpert Xpress SARS-CoV-2/FLU/RSV  testing. Fact Sheet for Patients: https://www.moore.com/ Fact Sheet for Healthcare Providers: https://www.young.biz/ This test is not yet approved or cleared by the [place] FDA and  has been authorized for detection and/or diagnosis of SARS-CoV-2 by  FDA under an Emergency Use Authorization (EUA). This EUA will remain  in effect (meaning this test can be used) for the duration of the  Covid-19 declaration under Section 564(b)(1) of the Act, 21  U.S.C. section 360bbb-3(b)(1), unless the authorization is  terminated or revoked. Performed at [facility], [address] [place], [place] [address]   C Difficile Quick Screen w PCR reflex     Status: Abnormal   Collection Time: 10/09/19 10:40 PM   Specimen: STOOL  Result Value Ref Range Status   C Diff antigen POSITIVE (A) NEGATIVE Final   C Diff toxin NEGATIVE NEGATIVE Final   C Diff interpretation Results are indeterminate. See PCR results.  Final    Comment: Performed at [facility], [address] [place], [place] [address]  C. Diff by PCR, Reflexed     Status: Abnormal   Collection Time: 10/09/19 10:40 PM  Result Value Ref Range Status   Toxigenic C. Difficile by PCR POSITIVE (A) NEGATIVE Final    Comment: Positive for toxigenic C. difficile with little to no toxin production. Only treat if clinical presentation suggests symptomatic illness. Performed at [facility], [address] [place], [place] [address]     Coagulation Studies: No results for input(s): LABPROT, INR in the last 72 hours.  Urinalysis: Recent Labs    10/08/19 2155  COLORURINE  AMBER*  LABSPEC 1.024  PHURINE 5.0  GLUCOSEU NEGATIVE  HGBUR NEGATIVE  BILIRUBINUR SMALL*  KETONESUR 5*  PROTEINUR 100*  NITRITE NEGATIVE  LEUKOCYTESUR TRACE*      Imaging: US RENAL  Result Date: 10/09/2019 CLINICAL DATA:  Acute renal injury. EXAM: RENAL / URINARY TRACT ULTRASOUND COMPLETE COMPARISON:  CT scan 10/09/2019 FINDINGS: Right Kidney: Renal measurements: 10.8 x 5.7 x 6.9 cm = volume: 223.4 mL. Normal renal cortical thickness and echogenicity without focal lesions or hydronephrosis. Left Kidney: Renal measurements: 12.8 x 7.1 x 6.1 cm = volume: Inter 290.7 mL. Normal renal cortical thickness and echogenicity without focal lesions or hydronephrosis. Bladder: Appears normal for degree of bladder distention. Other: None. IMPRESSION: 1. Limited examination due to body habitus and poor sonographic window. 2. No renal lesions or hydronephrosis. Electronically Signed   By: Rudie Meyer M.D.   On: 10/09/2019 08:01   CT Renal Stone Study  Result Date: 10/09/2019 CLINICAL DATA:  Flank pain. Kidney stone suspected. Left-sided pain. Difficulty urinating. EXAM: CT ABDOMEN AND PELVIS WITHOUT CONTRAST TECHNIQUE: Multidetector CT imaging of the abdomen and pelvis was performed following the standard protocol without IV contrast. COMPARISON:  None. FINDINGS: Lower chest: Lung bases are clear without focal nodule, mass, or airspace disease.  The heart size is normal. No significant pleural or pericardial effusion is present. Hepatobiliary: Hepatic steatosis is evident. No discrete lesions are present. The common bile duct and gallbladder scratched at density in the gallbladder likely represents sludge. No discrete stone is evident. Inflammatory changes present. Pancreas: Unremarkable. No pancreatic ductal dilatation or surrounding inflammatory changes. Spleen: Normal in size without focal abnormality. Adrenals/Urinary Tract: Adrenal glands are normal bilaterally. Kidneys and ureters are within normal  limits. No stone or mass lesion is present. The urinary bladder is within normal limits. Stomach/Bowel: Stomach and duodenum are within normal limits. Small bowel is unremarkable. Terminal ileum is within normal limits. Appendix is visualized and normal. The ascending and transverse colon are normal. Descending colon is unremarkable. Diverticular changes are present throughout the sigmoid colon. No focal inflammation is evident to suggest diverticulitis. Vascular/Lymphatic: Atherosclerotic changes are present in the aorta branch vessels without aneurysm. No significant retroperitoneal adenopathy is present. Reproductive: Prostate is unremarkable. Other: No abdominal wall hernia or abnormality. No abdominopelvic ascites. Musculoskeletal: Vacuum disc is present at L5-S1. Degenerative changes contribute to central and foraminal narrowing at L4-5 and L5-S1. Vertebral body heights are maintained. No focal lytic or blastic lesions are present. Bony pelvis is normal. Hips are located and within normal limits. IMPRESSION: 1. No acute or focal lesion to explain the patient's symptoms. 2. Hepatic steatosis. 3. Sigmoid diverticulosis without diverticulitis. 4. Degenerative changes in the lower lumbar spine. 5. Aortic Atherosclerosis (ICD10-I70.0). Electronically Signed   By: Marin Roberts M.D.   On: 10/09/2019 04:57     Medications:   . sodium chloride 100 mL/hr at 10/10/19 1239   . calcium carbonate  2 tablet Oral TID WC  . heparin  5,000 Units Subcutaneous Q8H  . latanoprost  1 drop Both Eyes QHS  . loratadine  10 mg Oral Daily  . nicotine  21 mg Transdermal Daily  . pantoprazole  40 mg Oral Daily  . vancomycin  125 mg Oral QID   acetaminophen **OR** acetaminophen, alum & mag hydroxide-simeth, ondansetron **OR** ondansetron (ZOFRAN) IV, traZODone  Assessment/ Plan:  49 y.o. male with a PMHx of COPD, hypertension, obstructive sleep apnea, peripheral neuropathy, pulmonary hypertension, glaucoma who  was admitted to [facility] on 10/09/2019 for evaluation of abdominal pain, loose stools, and diminished urine output.  Found to be C. difficile positive.  1.  Acute kidney injury with baseline creatinine of 0.8 on 02/28/2019.  Suspect that acute kidney injury is multifactorial with contributions from nausea, poor intake, diarrhea, and use of diuretics in the form of furosemide and spironolactone. -Renal function significantly improved as creatinine down to 2.4 today.  Continue IV fluid hydration with 0.9 normal saline at 100 cc/h.  Hopefully renal function will normalize by tomorrow.  Avoid further nephrotoxins as possible.  2.  Hyponatremia.  Serum sodium now up to 138.  Continue 0.9 normal saline as above.   LOS: 1 Sailor Hevia 4/23/20212:21 PM

## 2019-10-11 LAB — PROTEIN ELECTROPHORESIS, SERUM
A/G Ratio: 1.2 (ref 0.7–1.7)
Albumin ELP: 3.2 g/dL (ref 2.9–4.4)
Alpha-1-Globulin: 0.2 g/dL (ref 0.0–0.4)
Alpha-2-Globulin: 0.7 g/dL (ref 0.4–1.0)
Beta Globulin: 1.1 g/dL (ref 0.7–1.3)
Gamma Globulin: 0.7 g/dL (ref 0.4–1.8)
Globulin, Total: 2.7 g/dL (ref 2.2–3.9)
Total Protein ELP: 5.9 g/dL — ABNORMAL LOW (ref 6.0–8.5)

## 2019-10-12 LAB — MPO/PR-3 (ANCA) ANTIBODIES
ANCA Proteinase 3: <3.5 U/mL (ref 0.0–3.5)
Myeloperoxidase Abs: <9 U/mL (ref 0.0–9.0)

## 2019-10-13 LAB — PROTEIN ELECTRO, RANDOM URINE
Albumin ELP, Urine: 24.1 %
Alpha-1-Globulin, U: 6.5 %
Alpha-2-Globulin, U: 11.8 %
Beta Globulin, U: 29 %
Gamma Globulin, U: 28.6 %
Total Protein, Urine: 30.4 mg/dL

## 2019-10-14 LAB — STOOL CULTURE: E coli, Shiga toxin Assay: NEGATIVE

## 2019-10-14 LAB — STOOL CULTURE REFLEX - CMPCXR

## 2019-10-14 LAB — STOOL CULTURE REFLEX - RSASHR

## 2021-04-08 IMAGING — CT CT RENAL STONE PROTOCOL
2 of 4 series · 16 of 46 positions shown, 18 images · non-contrast
Comparison: None.

CLINICAL DATA: Flank pain. Kidney stone suspected. Left-sided pain.
Difficulty urinating.

EXAM:
CT ABDOMEN AND PELVIS WITHOUT CONTRAST
TECHNIQUE: Multidetector CT imaging of the abdomen and pelvis was performed
following the standard protocol without IV contrast.

[Series 2: stone full standard · axial · 0.88mm/px · z∈[-698,-234]mm · 13 of 103 slices shown, 15 images]
[im 5/103  soft-tissue]
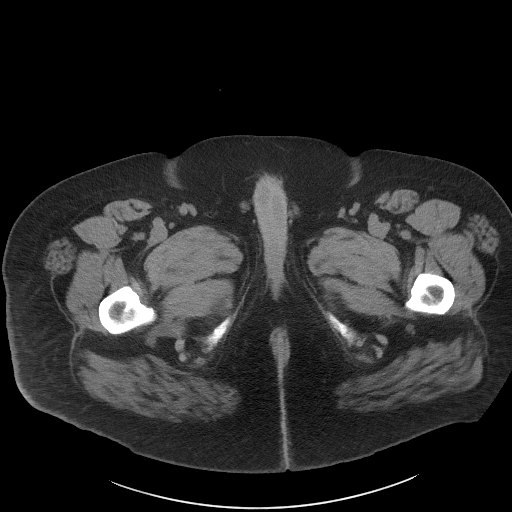
[im 5/103  bone]
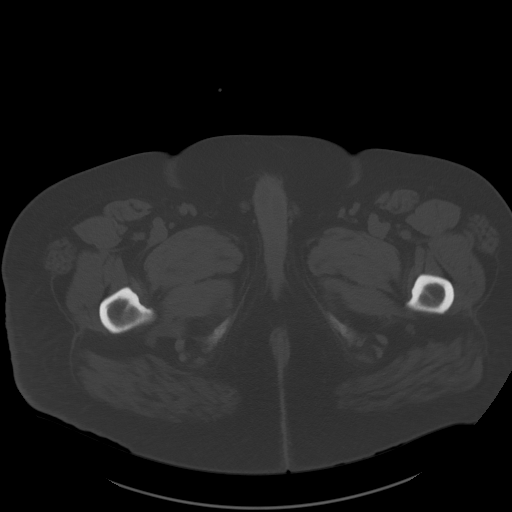
[im 13/103  soft-tissue]
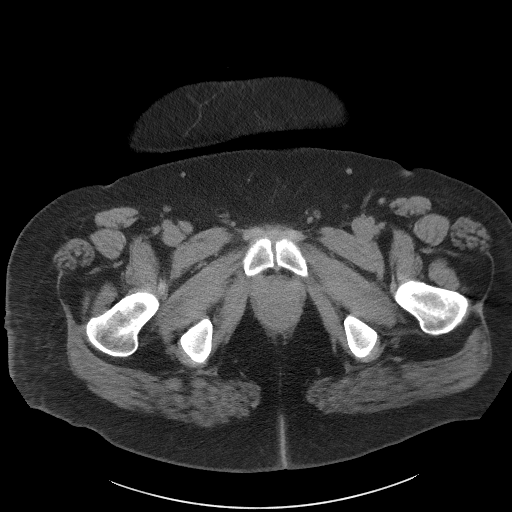
[im 22/103  soft-tissue]
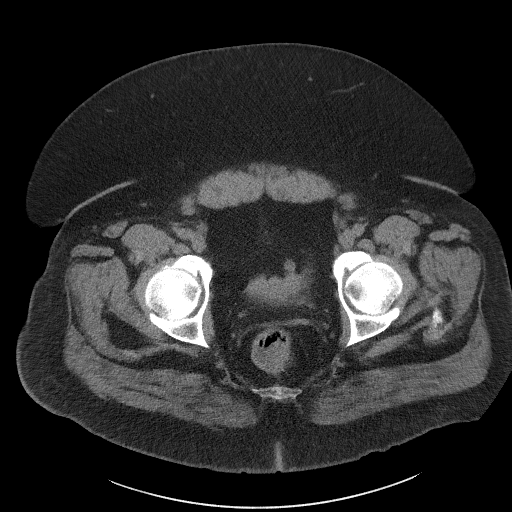
[im 30/103  soft-tissue]
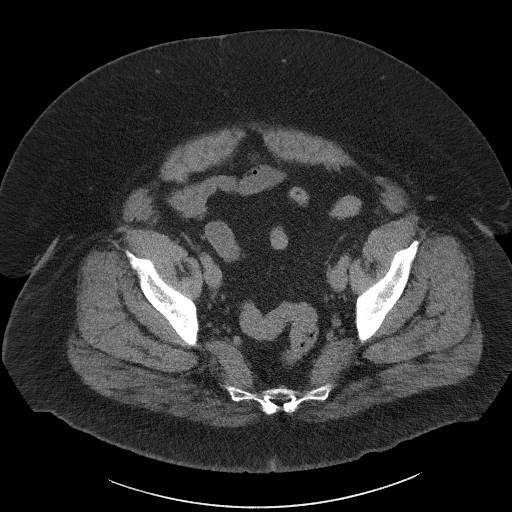
[im 35/103  soft-tissue]
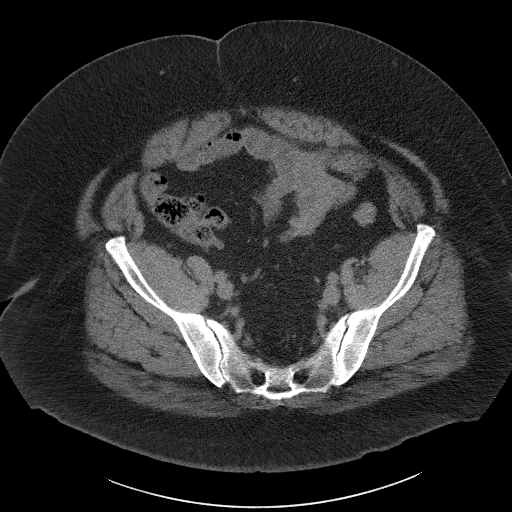
[im 43/103  soft-tissue]
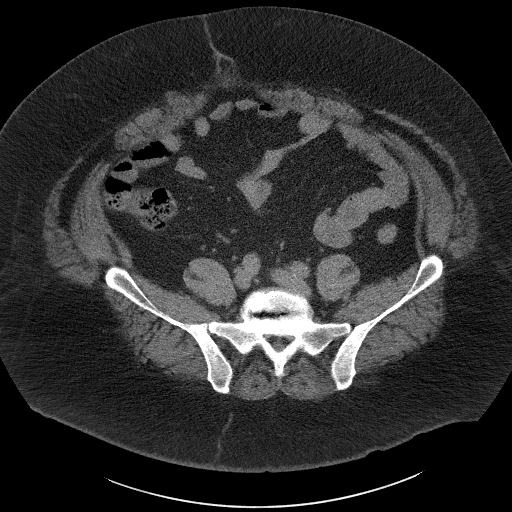
[im 52/103  soft-tissue]
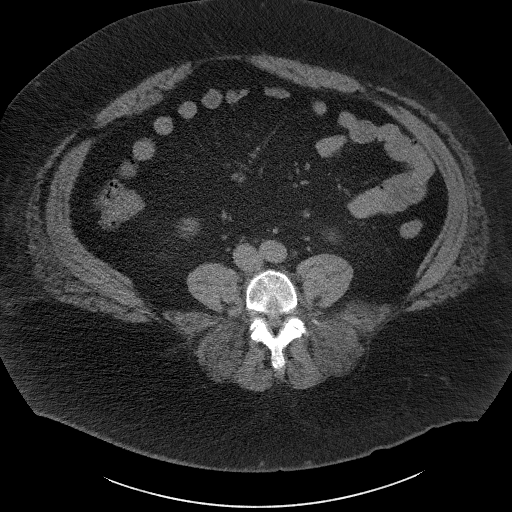
[im 60/103  soft-tissue]
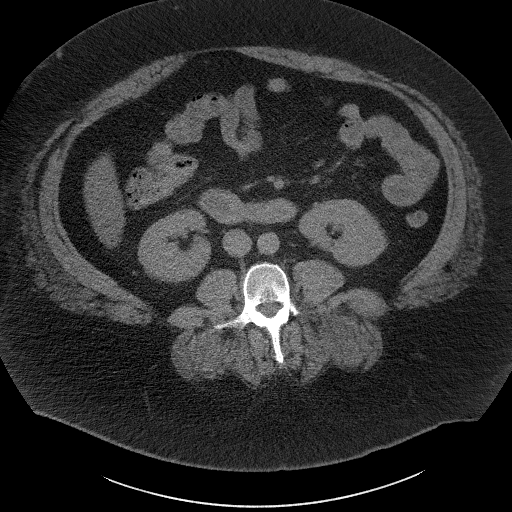
[im 69/103  soft-tissue]
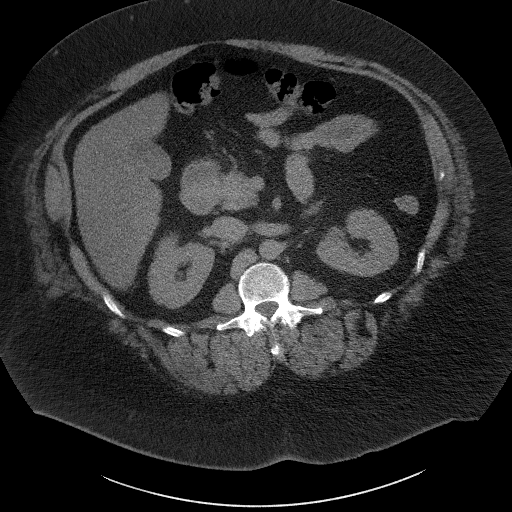
[im 69/103  bone]
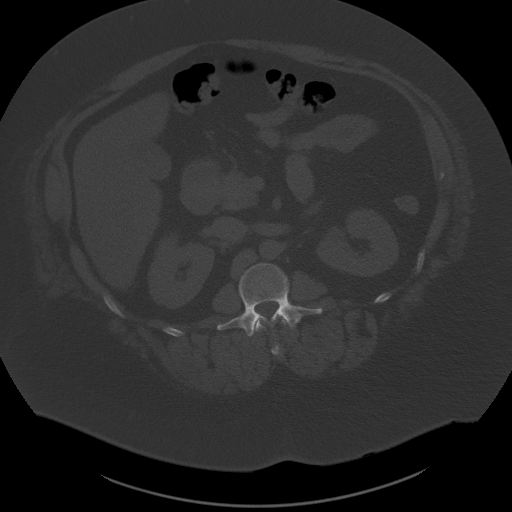
[im 73/103  soft-tissue]
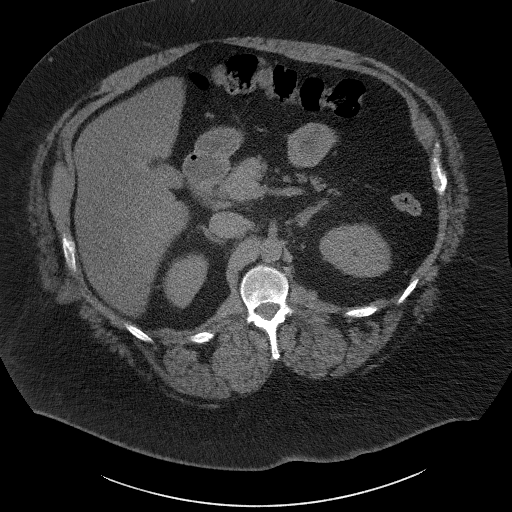
[im 81/103  soft-tissue]
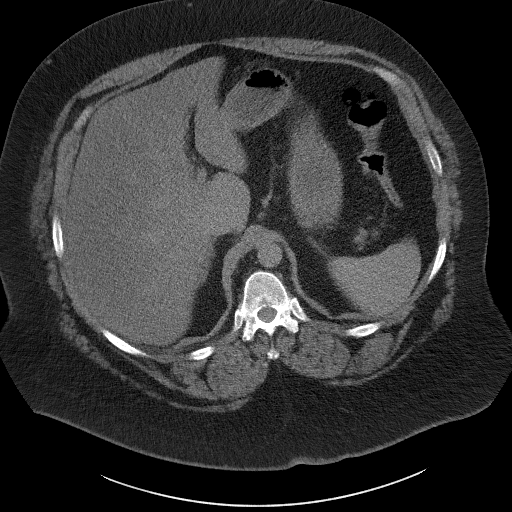
[im 90/103  soft-tissue]
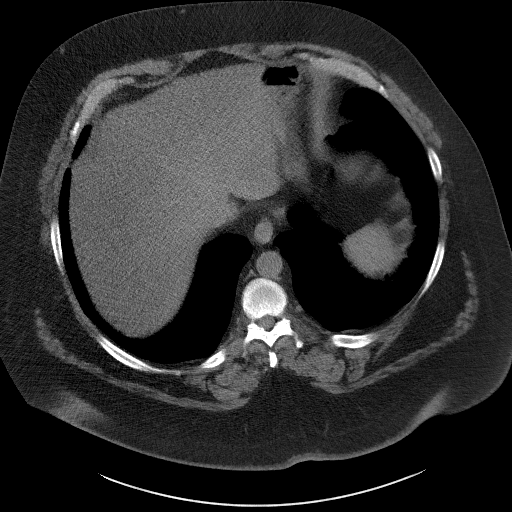
[im 98/103  soft-tissue]
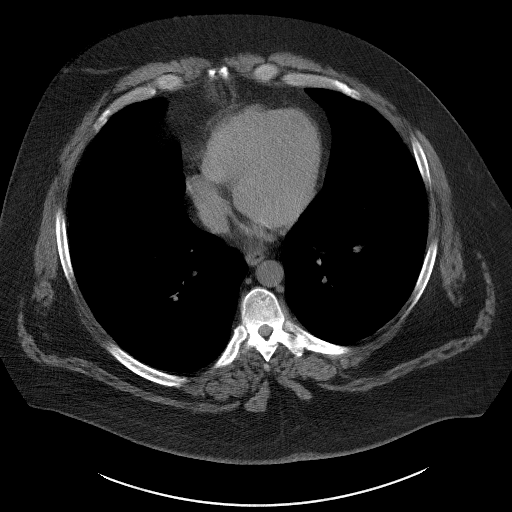

[Series 5: coronal · coronal · 0.95mm/px · 3 of 190 slices shown]
[im 64/190  soft-tissue]
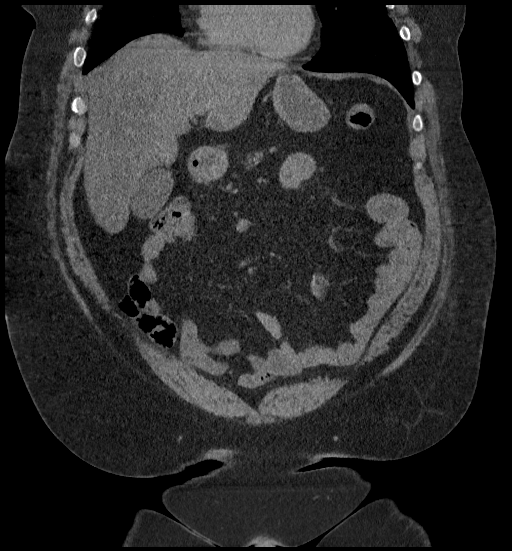
[im 85/190  soft-tissue]
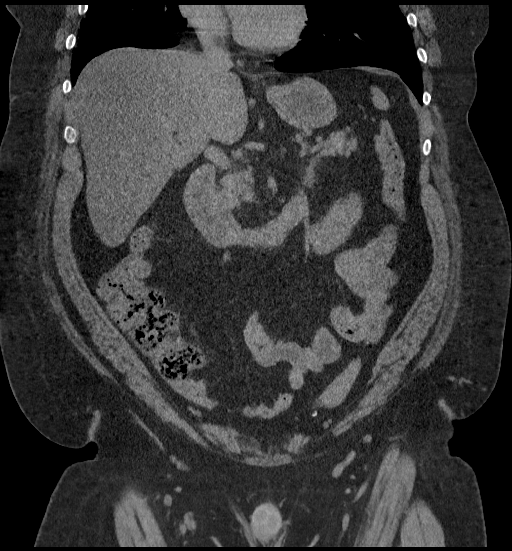
[im 106/190  soft-tissue]
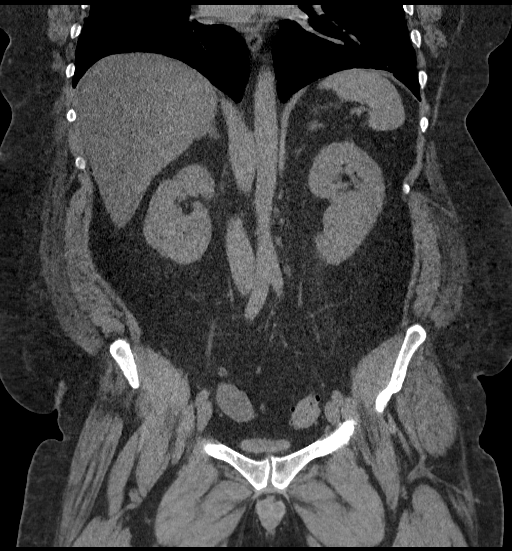

[16 of 46 positions shown; findings below may reference images not displayed]

FINDINGS: Lower chest: Lung bases are clear without focal nodule, mass, or
airspace disease. The heart size is normal. No significant pleural
or pericardial effusion is present.

Hepatobiliary: Hepatic steatosis is evident. No discrete lesions are
present. The common bile duct and gallbladder scratched at density
in the gallbladder likely represents sludge. No discrete stone is
evident. Inflammatory changes present.

Pancreas: Unremarkable. No pancreatic ductal dilatation or
surrounding inflammatory changes.

Spleen: Normal in size without focal abnormality.

Adrenals/Urinary Tract: Adrenal glands are normal bilaterally.
Kidneys and ureters are within normal limits. No stone or mass
lesion is present. The urinary bladder is within normal limits.

Stomach/Bowel: Stomach and duodenum are within normal limits. Small
bowel is unremarkable. Terminal ileum is within normal limits.
Appendix is visualized and normal. The ascending and transverse
colon are normal. Descending colon is unremarkable. Diverticular
changes are present throughout the sigmoid colon. No focal
inflammation is evident to suggest diverticulitis.

Vascular/Lymphatic: Atherosclerotic changes are present in the aorta
branch vessels without aneurysm. No significant retroperitoneal
adenopathy is present.

Reproductive: Prostate is unremarkable.

Other: No abdominal wall hernia or abnormality. No abdominopelvic
ascites.

Musculoskeletal: Vacuum disc is present at L5-S1. Degenerative
changes contribute to central and foraminal narrowing at L4-5 and
L5-S1. Vertebral body heights are maintained. No focal lytic or
blastic lesions are present. Bony pelvis is normal. Hips are located
and within normal limits.
IMPRESSION: 1. No acute or focal lesion to explain the patient's symptoms.
2. Hepatic steatosis.
3. Sigmoid diverticulosis without diverticulitis.
4. Degenerative changes in the lower lumbar spine.
5. Aortic Atherosclerosis ([id number]).

## 2022-07-17 ENCOUNTER — Emergency Department
Admission: EM | Admit: 2022-07-17 | Discharge: 2022-07-17 | Disposition: A | Discharge status: Home / Self Care | Payer: Medicare Other | Attending: Emergency Medicine | Admitting: Emergency Medicine

## 2022-07-17 ENCOUNTER — Ambulatory Visit (INDEPENDENT_AMBULATORY_CARE_PROVIDER_SITE_OTHER): Payer: Medicare Other | Admitting: Internal Medicine

## 2022-07-17 VITALS — BP 175/121 | HR 101 | Resp 16 | Ht 75.0 in | Wt 332.0 lb

## 2022-07-17 DIAGNOSIS — I10 Essential (primary) hypertension: Secondary | ICD-10-CM

## 2022-07-17 DIAGNOSIS — G4733 Obstructive sleep apnea (adult) (pediatric): Secondary | ICD-10-CM | POA: Diagnosis not present

## 2022-07-17 DIAGNOSIS — I272 Pulmonary hypertension, unspecified: Secondary | ICD-10-CM | POA: Diagnosis not present

## 2022-07-17 DIAGNOSIS — Z79899 Other long term (current) drug therapy: Secondary | ICD-10-CM | POA: Diagnosis not present

## 2022-07-17 MED ORDER — LISINOPRIL 10 MG PO TABS
10.0000 mg | ORAL_TABLET | Freq: Once | ORAL | Status: AC
  Administered 2022-07-17: 10 mg via ORAL
  Filled 2022-07-17: qty 1

## 2022-07-17 NOTE — ED Provider Notes (Signed)
[facility] Provider Note  Patient Contact: 3:41 PM (approximate)   History   Hypertension (/)   HPI  Wayne Wilkerson is a 52 y.o. male who presents the emergency department for evaluation of his hypertension.  Patient states that he went to the sleep doctor and this morning, his blood pressure was elevated at 170/110.  Patient states that he did not take any of his blood pressure medication.  States that he was also very anxious.  He arrived with a blood pressure of 182/115 but states that the sleep specialist had stressed him out over talking about what could go wrong with a high blood pressure.  Patient still has not taken his medication.  He states that he takes 5 mg of lisinopril every day.  He is prescribed 10 but was having issues with hypotension and is at a half dose currently.  Patient feels that his blood pressure secondary to not taking his medication this morning.  He also states that he has no other symptoms to include headache, vision changes, chest pain, shortness of breath.     Physical Exam   Triage Vital Signs: ED Triage Vitals  Enc Vitals Group     BP 07/17/22 1242 (!) 182/115     Pulse Rate 07/17/22 1242 89     Resp 07/17/22 1242 17     Temp 07/17/22 1242 98.1 F (36.7 C)     Temp Source 07/17/22 1242 Oral     SpO2 07/17/22 1242 95 %     Weight 07/17/22 1243 (!) 330 lb (149.7 kg)     Height --      Head Circumference --      Peak Flow --      Pain Score 07/17/22 1243 0     Pain Loc --      Pain Edu? --      Excl. in [place]? --     Most recent vital signs: Vitals:   07/17/22 1242  BP: (!) 182/115  Pulse: 89  Resp: 17  Temp: 98.1 F (36.7 C)  SpO2: 95%     General: Alert and in no acute distress.  Neck: No stridor. No cervical spine tenderness to palpation.  Cardiovascular:  Good peripheral perfusion Respiratory: Normal respiratory effort without tachypnea or retractions. Lungs CTAB. Musculoskeletal: Full range of motion to  all extremities.  Neurologic:  No gross focal neurologic deficits are appreciated.  Cranial nerves grossly intact Skin:   No rash noted Other:   ED Results / Procedures / Treatments   Labs (all labs ordered are listed, but only abnormal results are displayed) Labs Reviewed - No data to display   EKG     RADIOLOGY    No results found.  PROCEDURES:  Critical Care performed: No  Procedures   MEDICATIONS ORDERED IN ED: Medications  lisinopril (ZESTRIL) tablet 10 mg (has no administration in time range)     IMPRESSION / MDM / ASSESSMENT AND PLAN / ED COURSE  I reviewed the triage vital signs and the nursing notes.                                 Differential diagnosis includes, but is not limited to, hypertension, hypertensive urgency, hypertensive emergency   Patient's presentation is most consistent with acute presentation with potential threat to life or bodily function.   Patient's diagnosis is consistent with hypertension.  Patient presents emergency  department complaining of elevated blood pressure reading.  Patient did not take his blood pressure medication this morning prior to being seen by the sleep specialist.  States that his blood pressure was up as he was very anxious.  Patient was referred to the emergency department given his reading of 170/110.  He arrived with a blood pressure of 182/115.  In the room and took the patient's blood pressure and it was 158/100.  This time patient has not taken his medication.  He has no other symptoms.  He is supposed to be taking 10 mg of lisinopril but only takes 5 daily due to issues with hypotension.  I suspect that today's readings were secondary to anxiety plus not taking his medication.  I will give the patient a dose of his medication here.  No indication for workup currently.  Concerning signs and symptoms and return precautions discussed with the patient.  I recommend the patient follow his blood pressure at home,  take his regular medication.  If he is above 001 systolic he should take a full dose of the lisinopril at 10 mg.  If he is below 749 systolic continue his current regimen of 5 mg.  Follow-up primary care. Patient is given ED precautions to return to the ED for any worsening or new symptoms.     FINAL CLINICAL IMPRESSION(S) / ED DIAGNOSES   Final diagnoses:  Primary hypertension     Rx / DC Orders   ED Discharge Orders     None        Note:  This document was prepared using Dragon voice recognition software and may include unintentional dictation errors.   Darletta Moll, PA-C 07/17/22 1545    Harvest Dark, MD 07/17/22 [phone]

## 2022-07-17 NOTE — Progress Notes (Signed)
Sleep Medicine   Office Visit  Patient Name: Wayne Wilkerson DOB: 08/13/1970 MRN [id number]    Chief Complaint: history of OSA/pulmonary hypertension  Brief History:  Brodey presents with a 10 year history of sleep apnea. He was previously on CPAP and about 2 years he starting using a Trilogy machine. Sleep quality is good. This is noted most nights. Prior to PAP therapy, the patient's bed partner reports loud snoring at night. The patient relates the following symptoms: loud snoring and excess daytime sleepiness are also present. The patient does not have a set time when he goes to sleep and wakes up. Sleep quality is same when outside home environment.  Patient has noted no restlessness of his legs at night.  The patient  relates no unusual behavior during the night.  The patient has a history of psychiatric problems (per chart, schizophrenia, for which pt has declined evaluation). The Epworth Sleepiness Score is 3 out of 24 .  The patient relates  Cardiovascular risk factors include: hypertension and diastolic dysfunction.  He continues on home oxygen at 2.5 lpm.    ROS  General: (-) fever, (-) chills, (-) night sweat Nose and Sinuses: (-) nasal stuffiness or itchiness, (-) postnasal drip, (-) nosebleeds, (-) sinus trouble. Mouth and Throat: (-) sore throat, (-) hoarseness. Neck: (-) swollen glands, (-) enlarged thyroid, (-) neck pain. Respiratory: - cough, - shortness of breath, - wheezing. Neurologic: - numbness, + tingling. Psychiatric: - anxiety, - depression Sleep behavior: -sleep paralysis -hypnogogic hallucinations -dream enactment      -vivid dreams -cataplexy -night terrors -sleep walking   Current Medication: Outpatient Encounter Medications as of 07/17/2022  Medication Sig   gabapentin (NEURONTIN) 300 MG capsule Take by mouth.   ipratropium (ATROVENT HFA) 17 MCG/ACT inhaler Inhale into the lungs.   lisinopril (ZESTRIL) 10 MG tablet Take by mouth.   MAGNESIUM PO Take by  mouth.   albuterol (VENTOLIN HFA) 108 (90 Base) MCG/ACT inhaler Inhale 2 puffs into the lungs every 6 (six) hours as needed for shortness of breath or wheezing.   guaiFENesin (MUCINEX) 600 MG 12 hr tablet Take 600 mg by mouth 2 (two) times daily as needed for to loosen phlegm.   latanoprost (XALATAN) 0.005 % ophthalmic solution Place 1 drop into both eyes at bedtime.    loratadine (CLARITIN) 10 MG tablet Take 10 mg by mouth daily as needed (allergies).    magnesium oxide (MAG-OX) 400 MG tablet Take 800 mg by mouth in the morning and at bedtime.   nicotine (NICODERM CQ - DOSED IN MG/24 HOURS) 21 mg/24hr patch Place 21 mg onto the skin daily as needed for smoking cessation.   ondansetron (ZOFRAN) 4 MG tablet Take 1 tablet (4 mg total) by mouth every 6 (six) hours as needed for nausea.   SPIRIVA HANDIHALER 18 MCG inhalation capsule Place 18 mcg into inhaler and inhale daily.   Triamcinolone Acetonide (NASACORT AQ NA) Place 1-2 sprays into both nostrils daily as needed (allergies).    No facility-administered encounter medications on file as of 07/17/2022.    Surgical History: History reviewed. No pertinent surgical history.  Medical History: Past Medical History:  Diagnosis Date   Bronchitis    COPD (chronic obstructive pulmonary disease) (HCC)    Glaucoma    Hypertension    Neuropathy    Pulmonary hypertension (HCC)    Sleep apnea     Family History: Non contributory to the present illness  Social History: Social History   Socioeconomic History  Marital status: Single    Spouse name: Not on file   Number of children: Not on file   Years of education: Not on file   Highest education level: Not on file  Occupational History   Not on file  Tobacco Use   Smoking status: Every Day   Smokeless tobacco: Never  Vaping Use   Vaping Use: Never used  Substance and Sexual Activity   Alcohol use: Yes   Drug use: No   Sexual activity: Not on file  Other Topics Concern   Not on  file  Social History Narrative   Not on file   Social Determinants of Health   Financial Resource Strain: Not on file  Food Insecurity: Not on file  Transportation Needs: Not on file  Physical Activity: Not on file  Stress: Not on file  Social Connections: Not on file  Intimate Partner Violence: Not on file    Vital Signs: Blood pressure (!) 175/121, pulse (!) 101, resp. rate 16, height 6\' 3"  (1.905 m), weight (!) 332 lb (150.6 kg), SpO2 98 %. Body mass index is 41.5 kg/m.   Examination: General Appearance: The patient is well-developed, well-nourished, and in no distress. Neck Circumference: 48 cm Skin: Gross inspection of skin unremarkable. Head: normocephalic, no gross deformities. Eyes: no gross deformities noted. ENT: ears appear grossly normal Neurologic: Alert and oriented. No involuntary movements.    STOP BANG RISK ASSESSMENT S (snore) Have you been told that you snore?     NO   T (tired) Are you often tired, fatigued, or sleepy during the day?   YES  O (obstruction) Do you stop breathing, choke, or gasp during sleep? NO   P (pressure) Do you have or are you being treated for high blood pressure? YES   B (BMI) Is your body index greater than 35 kg/m? YES   A (age) Are you 34 years old or older? YES   N (neck) Do you have a neck circumference greater than 16 inches?   YES   G (gender) Are you a male? YES   TOTAL STOP/BANG "YES" ANSWERS 6                                                               A STOP-Bang score of 2 or less is considered low risk, and a score of 5 or more is high risk for having either moderate or severe OSA. For people who score 3 or 4, doctors may need to perform further assessment to determine how likely they are to have OSA.         EPWORTH SLEEPINESS SCALE:  Scale:  (0)= no chance of dozing; (1)= slight chance of dozing; (2)= moderate chance of dozing; (3)= high chance of dozing  Chance  Situtation    Sitting and  reading: 0    Watching TV: 1    Sitting Inactive in public: 0    As a passenger in car: 0      Lying down to rest: 2    Sitting and talking: 0    Sitting quielty after lunch: 0    In a car, stopped in traffic: 0   TOTAL SCORE:   3 out of 24    SLEEP STUDIES:  Had a sleep  study over 10 years ago and does NOT have a copy of the results   LABS: No results found for this or any previous visit (from the past 2160 hour(s)).  Radiology: US RENAL  Result Date: 10/09/2019 CLINICAL DATA:  Acute renal injury. EXAM: RENAL / URINARY TRACT ULTRASOUND COMPLETE COMPARISON:  CT scan 10/09/2019 FINDINGS: Right Kidney: Renal measurements: 10.8 x 5.7 x 6.9 cm = volume: 223.4 mL. Normal renal cortical thickness and echogenicity without focal lesions or hydronephrosis. Left Kidney: Renal measurements: 12.8 x 7.1 x 6.1 cm = volume: Inter 290.7 mL. Normal renal cortical thickness and echogenicity without focal lesions or hydronephrosis. Bladder: Appears normal for degree of bladder distention. Other: None. IMPRESSION: 1. Limited examination due to body habitus and poor sonographic window. 2. No renal lesions or hydronephrosis. Electronically Signed   By: Rudie Meyer M.D.   On: 10/09/2019 08:01   CT Renal Stone Study  Result Date: 10/09/2019 CLINICAL DATA:  Flank pain. Kidney stone suspected. Left-sided pain. Difficulty urinating. EXAM: CT ABDOMEN AND PELVIS WITHOUT CONTRAST TECHNIQUE: Multidetector CT imaging of the abdomen and pelvis was performed following the standard protocol without IV contrast. COMPARISON:  None. FINDINGS: Lower chest: Lung bases are clear without focal nodule, mass, or airspace disease. The heart size is normal. No significant pleural or pericardial effusion is present. Hepatobiliary: Hepatic steatosis is evident. No discrete lesions are present. The common bile duct and gallbladder scratched at density in the gallbladder likely represents sludge. No discrete stone is evident.  Inflammatory changes present. Pancreas: Unremarkable. No pancreatic ductal dilatation or surrounding inflammatory changes. Spleen: Normal in size without focal abnormality. Adrenals/Urinary Tract: Adrenal glands are normal bilaterally. Kidneys and ureters are within normal limits. No stone or mass lesion is present. The urinary bladder is within normal limits. Stomach/Bowel: Stomach and duodenum are within normal limits. Small bowel is unremarkable. Terminal ileum is within normal limits. Appendix is visualized and normal. The ascending and transverse colon are normal. Descending colon is unremarkable. Diverticular changes are present throughout the sigmoid colon. No focal inflammation is evident to suggest diverticulitis. Vascular/Lymphatic: Atherosclerotic changes are present in the aorta branch vessels without aneurysm. No significant retroperitoneal adenopathy is present. Reproductive: Prostate is unremarkable. Other: No abdominal wall hernia or abnormality. No abdominopelvic ascites. Musculoskeletal: Vacuum disc is present at L5-S1. Degenerative changes contribute to central and foraminal narrowing at L4-5 and L5-S1. Vertebral body heights are maintained. No focal lytic or blastic lesions are present. Bony pelvis is normal. Hips are located and within normal limits. IMPRESSION: 1. No acute or focal lesion to explain the patient's symptoms. 2. Hepatic steatosis. 3. Sigmoid diverticulosis without diverticulitis. 4. Degenerative changes in the lower lumbar spine. 5. Aortic Atherosclerosis (ICD10-I70.0). Electronically Signed   By: Marin Roberts M.D.   On: 10/09/2019 04:57    No results found.  No results found.    Assessment and Plan: Patient Active Problem List   Diagnosis Date Noted   Morbid (severe) obesity due to excess calories (HCC) 12/14/2021   C. difficile diarrhea 10/10/2019   AKI (acute kidney injury) (HCC) 10/09/2019   Mood disorder (HCC) 06/21/2019   OSA (obstructive sleep apnea)  06/21/2019   Polyneuropathy 06/21/2019   Bilateral foot pain 05/22/2019   Numbness and tingling of both feet 05/22/2019   Pulmonary hypertension (HCC) 12/02/2018   Essential hypertension 11/29/2018   Chronic diarrhea 11/29/2018   Edema 11/29/2018     1. OSA (obstructive sleep apnea) Pt has gasping, choking, and snoring. He has a history  of OSA but no PSG can be located. He has been on AVAPS, and at this point a PSG needs to be done to determine whether he needs to remain on AVAPs or go to BIPAP.   2. Pulmonary hypertension ([place]) This is suspected based on an elevated pc02 over 100 in past.   3. Essential hypertension Very elevated today. I advised the patient to go to the Emergency Room.  Hypertension Counseling:   The following hypertensive lifestyle modification were recommended and discussed:  1. Limiting alcohol intake to less than 1 oz/day of ethanol:(24 oz of beer or 8 oz of wine or 2 oz of 100-proof whiskey). 2. Take baby ASA 81 mg daily. 3. Importance of regular aerobic exercise and losing weight. 4. Reduce dietary saturated fat and cholesterol intake for overall cardiovascular health. 5. Maintaining adequate dietary potassium, calcium, and magnesium intake. 6. Regular monitoring of the blood pressure. 7. Reduce sodium intake to less than 100 mmol/day (less than 2.3 gm of sodium or less than 6 gm of sodium choride)     General Counseling: I have discussed the findings of the evaluation and examination with Basilio.  I have also discussed any further diagnostic evaluation thatmay be needed or ordered today. Lindy verbalizes understanding of the findings of todays visit. We also reviewed his medications today and discussed drug interactions and side effects including but not limited excessive drowsiness and altered mental states. We also discussed that there is always a risk not just to him but also people around him. he has been encouraged to call the office with any questions or  concerns that should arise related to todays visit.  No orders of the defined types were placed in this encounter.       I have personally obtained a history, evaluated the patient, evaluated pertinent data, formulated the assessment and plan and placed orders.   This patient was seen today by Tressie Ellis, PA-C in collaboration with Dr. Devona Konig.   Allyne Gee, MD [facility] Diplomate ABMS Pulmonary and Critical Care Medicine Sleep medicine

## 2022-07-17 NOTE — ED Triage Notes (Signed)
Pt sts that his PCP recommended that he needed to come and get his BP evaluated. Pt sts that he wasn't sure if he wanted to come to the ED however he just came.

## 2023-01-03 ENCOUNTER — Other Ambulatory Visit: Payer: Self-pay | Admitting: Specialist

## 2023-01-03 DIAGNOSIS — M7989 Other specified soft tissue disorders: Secondary | ICD-10-CM

## 2023-01-08 ENCOUNTER — Ambulatory Visit: Payer: 59 | Admitting: Internal Medicine

## 2023-01-08 VITALS — BP 131/87 | HR 73 | Resp 16 | Ht 75.0 in | Wt 350.0 lb

## 2023-01-08 DIAGNOSIS — F17200 Nicotine dependence, unspecified, uncomplicated: Secondary | ICD-10-CM | POA: Diagnosis not present

## 2023-01-08 DIAGNOSIS — G4733 Obstructive sleep apnea (adult) (pediatric): Secondary | ICD-10-CM

## 2023-01-08 DIAGNOSIS — I10 Essential (primary) hypertension: Secondary | ICD-10-CM | POA: Diagnosis not present

## 2023-01-08 DIAGNOSIS — Z7189 Other specified counseling: Secondary | ICD-10-CM

## 2023-01-08 NOTE — Progress Notes (Signed)
[facility] [address] [place], [place] [address]  Pulmonary Sleep Medicine   Office Visit Note  Patient Name: Wayne Wilkerson DOB: 24-Feb-1971 MRN [id number]    Chief Complaint: Obstructive Sleep Apnea visit  Brief History:  Wayne Wilkerson is seen today for follow up 3 months after setup on APAP at 12-20 cmh20.  The patient has a 10 year history of sleep apnea. Patient is using PAP nightly.  The patient feels rested after sleeping with PAP.  The patient reports benefiting from PAP use. Reported sleepiness is  improved and the Epworth Sleepiness Score is 5 out of 24. The patient does not take naps. The patient complains of the following: No complaints.  The compliance download shows 83% compliance with an average use time of 5:55 hours. The AHI is 0.2.  The patient does not complain of limb movements disrupting sleep.  ROS  General: (-) fever, (-) chills, (-) night sweat Nose and Sinuses: (-) nasal stuffiness or itchiness, (-) postnasal drip, (-) nosebleeds, (-) sinus trouble. Mouth and Throat: (-) sore throat, (-) hoarseness. Neck: (-) swollen glands, (-) enlarged thyroid, (-) neck pain. Respiratory: + cough, - shortness of breath, - wheezing. Neurologic: - numbness, - tingling. Psychiatric: - anxiety, - depression   Current Medication: Outpatient Encounter Medications as of 01/08/2023  Medication Sig   Magnesium 200 MG TABS Take 1 tablet by mouth 2 (two) times daily.   SPIRIVA RESPIMAT 1.25 MCG/ACT AERS Inhale into the lungs.   albuterol (VENTOLIN HFA) 108 (90 Base) MCG/ACT inhaler Inhale 2 puffs into the lungs every 6 (six) hours as needed for shortness of breath or wheezing.   gabapentin (NEURONTIN) 300 MG capsule Take by mouth.   guaiFENesin (MUCINEX) 600 MG 12 hr tablet Take 600 mg by mouth 2 (two) times daily as needed for to loosen phlegm.   ipratropium (ATROVENT HFA) 17 MCG/ACT inhaler Inhale into the lungs.   latanoprost (XALATAN) 0.005 % ophthalmic solution Place 1  drop into both eyes at bedtime.    lisinopril (ZESTRIL) 10 MG tablet Take by mouth.   loratadine (CLARITIN) 10 MG tablet Take 10 mg by mouth daily as needed (allergies).    magnesium oxide (MAG-OX) 400 MG tablet Take 800 mg by mouth in the morning and at bedtime.   MAGNESIUM PO Take by mouth.   nicotine (NICODERM CQ - DOSED IN MG/24 HOURS) 21 mg/24hr patch Place 21 mg onto the skin daily as needed for smoking cessation.   ondansetron (ZOFRAN) 4 MG tablet Take 1 tablet (4 mg total) by mouth every 6 (six) hours as needed for nausea.   Triamcinolone Acetonide (NASACORT AQ NA) Place 1-2 sprays into both nostrils daily as needed (allergies).    [DISCONTINUED] SPIRIVA HANDIHALER 18 MCG inhalation capsule Place 18 mcg into inhaler and inhale daily.   No facility-administered encounter medications on file as of 01/08/2023.    Surgical History: History reviewed. No pertinent surgical history.  Medical History: Past Medical History:  Diagnosis Date   Bronchitis    COPD (chronic obstructive pulmonary disease) (HCC)    Glaucoma    Hypertension    Neuropathy    Pulmonary hypertension (HCC)    Sleep apnea     Family History: Non contributory to the present illness  Social History: Social History   Socioeconomic History   Marital status: Single    Spouse name: Not on file   Number of children: Not on file   Years of education: Not on file   Highest  education level: Not on file  Occupational History   Not on file  Tobacco Use   Smoking status: Every Day   Smokeless tobacco: Never  Vaping Use   Vaping status: Never Used  Substance and Sexual Activity   Alcohol use: Yes   Drug use: No   Sexual activity: Not on file  Other Topics Concern   Not on file  Social History Narrative   Not on file   Social Determinants of Health   Financial Resource Strain: Low Risk  (01/03/2023)   Received from [facility]   Overall Financial Resource Strain (CARDIA)    Difficulty  of Paying Living Expenses: Not hard at all  Food Insecurity: No Food Insecurity (01/03/2023)   Received from [facility]   Hunger Vital Sign    Worried About Running Out of Food in the Last Year: Never true    Ran Out of Food in the Last Year: Never true  Transportation Needs: No Transportation Needs (01/03/2023)   Received from [facility] - Transportation    In the past 12 months, has lack of transportation kept you from medical appointments or from getting medications?: No    Lack of Transportation (Non-Medical): No  Physical Activity: Inactive (03/09/2021)   Received from [facility], [facility]   Exercise Vital Sign    Days of Exercise per Week: 0 days    Minutes of Exercise per Session: 0 min  Stress: Stress Concern Present (03/09/2021)   Received from [facility], [facility] of Occupational Health - Occupational Stress Questionnaire    Feeling of Stress : To some extent  Social Connections: Socially Isolated (03/09/2021)   Received from [facility], [facility]   Social Connection and Isolation Panel [NHANES]    Frequency of Communication with Friends and Family: Once a week    Frequency of Social Gatherings with Friends and Family: Twice a week    Attends Religious Services: Never    Database administrator or Organizations: No    Attends Banker Meetings: Never    Marital Status: Never married  Intimate Partner Violence: Not At Risk (03/09/2021)   Received from [facility], [facility]   Humiliation, Afraid, Rape, and Kick questionnaire    Fear of Current or Ex-Partner: No    Emotionally Abused: No    Physically Abused: No    Sexually Abused: No    Vital Signs: Blood pressure 131/87, pulse 73, resp. rate 16, height 6\' 3"  (1.905 m), weight (!) 350 lb (158.8 kg), SpO2 94%. Body mass index is 43.75 kg/m.    Examination: General Appearance: The patient is  well-developed, well-nourished, and in no distress. Neck Circumference: 48 cm Skin: Gross inspection of skin unremarkable. Head: normocephalic, no gross deformities. Eyes: no gross deformities noted. ENT: ears appear grossly normal Neurologic: Alert and oriented. No involuntary movements.  STOP BANG RISK ASSESSMENT S (snore) Have you been told that you snore?     NO   T (tired) Are you often tired, fatigued, or sleepy during the day?   NO  O (obstruction) Do you stop breathing, choke, or gasp during sleep? NO   P (pressure) Do you have or are you being treated for high blood pressure? YES   B (BMI) Is your body index greater than 35 kg/m? YES   A (age) Are you 2 years old or  older? YES   N (neck) Do you have a neck circumference greater than 16 inches?   YES   G (gender) Are you a male? YES   TOTAL STOP/BANG "YES" ANSWERS 5       A STOP-Bang score of 2 or less is considered low risk, and a score of 5 or more is high risk for having either moderate or severe OSA. For people who score 3 or 4, doctors may need to perform further assessment to determine how likely they are to have OSA.         EPWORTH SLEEPINESS SCALE:  Scale:  (0)= no chance of dozing; (1)= slight chance of dozing; (2)= moderate chance of dozing; (3)= high chance of dozing  Chance  Situtation    Sitting and reading: 1    Watching TV: 1    Sitting Inactive in public: 1    As a passenger in car: 1      Lying down to rest: 1    Sitting and talking: 0    Sitting quielty after lunch: 0    In a car, stopped in traffic: 0   TOTAL SCORE:   5 out of 24    SLEEP STUDIES:  PSG (08/15/22) AHI 54.5, min SPO2 73%   CPAP COMPLIANCE DATA:  Date Range: 09/26/22 - 10/25/22  Average Daily Use: 5:55 hours  Median Use: 6:16  Compliance for > 4 Hours: 25 days  AHI: 0.2 respiratory events per hour  Days Used: 30/30  Mask Leak: 88.2  95th Percentile Pressure: 12.4 cmh20         LABS: No  results found for this or any previous visit (from the past 2160 hour(s)).  Radiology: No results found.  No results found.  No results found.    Assessment and Plan: Patient Active Problem List   Diagnosis Date Noted   CPAP use counseling 01/08/2023   Morbid (severe) obesity due to excess calories (HCC) 12/14/2021   C. difficile diarrhea 10/10/2019   AKI (acute kidney injury) (HCC) 10/09/2019   Mood disorder (HCC) 06/21/2019   OSA (obstructive sleep apnea) 06/21/2019   Polyneuropathy 06/21/2019   Bilateral foot pain 05/22/2019   Numbness and tingling of both feet 05/22/2019   Pulmonary hypertension (HCC) 12/02/2018   Essential hypertension 11/29/2018   Chronic diarrhea 11/29/2018   Edema 11/29/2018    1. OSA (obstructive sleep apnea) The patient does tolerate PAP and reports  benefit from PAP use. The patient was reminded how to clean equipment  and advised to replace supplies routinely. He was on a trelegy/avaps machine in past, but is well controlled now with APAP 12-20.  The patient was also counselled on weight loss. The compliance is fair. The AHI is 0.2.   OSA on cpap- controlled. Continue with fair compliance with pap. CPAP continues to be medically necessary to treat this patient's OSA. F/u 5m   2. CPAP use counseling CPAP Counseling: had a lengthy discussion with the patient regarding the importance of PAP therapy in management of the sleep apnea. Patient appears to understand the risk factor reduction and also understands the risks associated with untreated sleep apnea. Patient will try to make a good faith effort to remain compliant with therapy. Also instructed the patient on proper cleaning of the device including the water must be changed daily if possible and use of distilled water is preferred. Patient understands that the machine should be regularly cleaned with appropriate recommended cleaning solutions that do not damage the  PAP machine for example given white  vinegar and water rinses. Other methods such as ozone treatment may not be as good as these simple methods to achieve cleaning.   3. Essential hypertension Hypertension Counseling:   The following hypertensive lifestyle modification were recommended and discussed:  1. Limiting alcohol intake to less than 1 oz/day of ethanol:(24 oz of beer or 8 oz of wine or 2 oz of 100-proof whiskey). 2. Take baby ASA 81 mg daily. 3. Importance of regular aerobic exercise and losing weight. 4. Reduce dietary saturated fat and cholesterol intake for overall cardiovascular health. 5. Maintaining adequate dietary potassium, calcium, and magnesium intake. 6. Regular monitoring of the blood pressure. 7. Reduce sodium intake to less than 100 mmol/day (less than 2.3 gm of sodium or less than 6 gm of sodium choride)    4. Smoker Smoking cessation counseling: Pt acknowledges the risks of long term smoking, he will try to quite smoking. Options for different medications including nicotine products, chewing gum, patch etc, Wellbutrin and Chantix is discussed Goal and date of compete cessation is discussed Total time spent in smoking cessation is 10 min.    General Counseling: I have discussed the findings of the evaluation and examination with Or[place]is.  I have also discussed any further diagnostic evaluation thatmay be needed or ordered today. Philopater verbalizes understanding of the findings of todays visit. We also reviewed his medications today and discussed drug interactions and side effects including but not limited excessive drowsiness and altered mental states. We also discussed that there is always a risk not just to him but also people around him. he has been encouraged to call the office with any questions or concerns that should arise related to todays visit.  No orders of the defined types were placed in this encounter.       I have personally obtained a history, examined the patient, evaluated laboratory and  imaging results, formulated the assessment and plan and placed orders. This patient was seen today by Emmaline Kluver, PA-C in collaboration with Dr. Freda Munro.   Yevonne Pax, MD [facility] Diplomate ABMS Pulmonary Critical Care Medicine and Sleep Medicine

## 2023-01-08 NOTE — Patient Instructions (Signed)

## 2023-01-09 ENCOUNTER — Ambulatory Visit
Admission: RE | Admit: 2023-01-09 | Discharge: 2023-01-09 | Disposition: A | Discharge status: Home / Self Care | Payer: 59 | Source: Ambulatory Visit | Attending: Specialist | Admitting: Specialist

## 2023-01-09 DIAGNOSIS — M7989 Other specified soft tissue disorders: Secondary | ICD-10-CM | POA: Diagnosis not present

## 2023-07-13 NOTE — Progress Notes (Signed)
[facility] [address] [place], [place] [address]  Pulmonary Sleep Medicine   Office Visit Note  Patient Name: Wayne Wilkerson DOB: [birth year] MRN [id number]    Chief Complaint: Obstructive Sleep Apnea visit  Brief History:  [name] is seen today for a 6 month follow up on APAP 12-20 cmh20.  The patient has a 10 year 6 month  history of sleep apnea. Patient is using PAP nightly.  The patient feels rested after sleeping with PAP.  The patient reports benefiting from PAP use. Reported sleepiness is  improved and the Epworth Sleepiness Score is 3 out of 24. The patient will sometimes take naps. The patient complains of the following: none.  The compliance download shows  73% compliance with an average use time of 6 hours 15 min. The AHI is 0.4  The patient does not complain of limb movements disrupting sleep. The patient continues to require PAP therapy in order to eliminate sleep apnea.   ROS  General: (-) fever, (-) chills, (-) night sweat Nose and Sinuses: (-) nasal stuffiness or itchiness, (-) postnasal drip, (-) nosebleeds, (-) sinus trouble. Mouth and Throat: (-) sore throat, (-) hoarseness. Neck: (-) swollen glands, (-) enlarged thyroid, (-) neck pain. Respiratory: + cough, + shortness of breath, + wheezing. Neurologic: - numbness, - tingling. Psychiatric: + anxiety, + depression   Current Medication: Outpatient Encounter Medications as of 07/16/2023  Medication Sig   amLODipine (NORVASC) 5 MG tablet Take by mouth.   SPIRIVA RESPIMAT 2.5 MCG/ACT AERS SMARTSIG:2 Puff(s) By Mouth Every Morning   Vitamin D, Ergocalciferol, (DRISDOL) 1.25 MG (50000 UNIT) CAPS capsule Take 50,000 Units by mouth once a week.   albuterol (VENTOLIN HFA) 108 (90 Base) MCG/ACT inhaler Inhale 2 puffs into the lungs every 6 (six) hours as needed for shortness of breath or wheezing.   gabapentin (NEURONTIN) 300 MG capsule Take by mouth.   ipratropium (ATROVENT HFA) 17 MCG/ACT inhaler Inhale  into the lungs.   latanoprost (XALATAN) 0.005 % ophthalmic solution Place 1 drop into both eyes at bedtime.    lisinopril (ZESTRIL) 10 MG tablet Take by mouth.   loratadine (CLARITIN) 10 MG tablet Take 10 mg by mouth daily as needed (allergies).    Magnesium 200 MG TABS Take 1 tablet by mouth 2 (two) times daily.   magnesium oxide (MAG-OX) 400 MG tablet Take 800 mg by mouth in the morning and at bedtime.   MAGNESIUM PO Take by mouth.   ondansetron (ZOFRAN) 4 MG tablet Take 1 tablet (4 mg total) by mouth every 6 (six) hours as needed for nausea.   Triamcinolone Acetonide (NASACORT AQ NA) Place 1-2 sprays into both nostrils daily as needed (allergies).    [DISCONTINUED] guaiFENesin (MUCINEX) 600 MG 12 hr tablet Take 600 mg by mouth 2 (two) times daily as needed for to loosen phlegm.   [DISCONTINUED] nicotine (NICODERM CQ - DOSED IN MG/24 HOURS) 21 mg/24hr patch Place 21 mg onto the skin daily as needed for smoking cessation.   [DISCONTINUED] SPIRIVA RESPIMAT 1.25 MCG/ACT AERS Inhale into the lungs.   No facility-administered encounter medications on file as of 07/16/2023.    Surgical History: No past surgical history on file.  Medical History: Past Medical History:  Diagnosis Date   Bronchitis    COPD (chronic obstructive pulmonary disease) (HCC)    Glaucoma    Hypertension    Neuropathy    Pulmonary hypertension (HCC)    Sleep apnea     Family History: Non  contributory to the present illness  Social History: Social History   Socioeconomic History   Marital status: Single    Spouse name: Not on file   Number of children: Not on file   Years of education: Not on file   Highest education level: Not on file  Occupational History   Not on file  Tobacco Use   Smoking status: Every Day   Smokeless tobacco: Never  Vaping Use   Vaping status: Never Used  Substance and Sexual Activity   Alcohol use: Yes   Drug use: No   Sexual activity: Not on file  Other Topics Concern    Not on file  Social History Narrative   Not on file   Social Drivers of Health   Financial Resource Strain: Low Risk  (07/03/2023)   Received from [facility]   Overall Financial Resource Strain (CARDIA)    Difficulty of Paying Living Expenses: Not hard at all  Food Insecurity: No Food Insecurity (07/03/2023)   Received from [facility]   Hunger Vital Sign    Worried About Running Out of Food in the Last Year: Never true    Ran Out of Food in the Last Year: Never true  Transportation Needs: No Transportation Needs (07/03/2023)   Received from [facility] - Transportation    In the past 12 months, has lack of transportation kept you from medical appointments or from getting medications?: No    Lack of Transportation (Non-Medical): No  Physical Activity: Inactive (03/09/2021)   Received from [facility], [facility]   Exercise Vital Sign    Days of Exercise per Week: 0 days    Minutes of Exercise per Session: 0 min  Stress: Stress Concern Present (03/09/2021)   Received from [facility], [facility] - Occupational Stress Questionnaire    Feeling of Stress : To some extent  Social Connections: Socially Isolated (03/09/2021)   Received from [facility], [facility]   Social Connection and Isolation Panel [NHANES]    Frequency of Communication with Friends and Family: Once a week    Frequency of Social Gatherings with Friends and Family: Twice a week    Attends Religious Services: Never    Database administrator or Organizations: No    Attends Banker Meetings: Never    Marital Status: Never married  Intimate Partner Violence: Not At Risk (03/09/2021)   Received from [facility], [facility]   Humiliation, Afraid, Rape, and Kick questionnaire    Fear of Current or Ex-Partner: No    Emotionally Abused: No    Physically Abused: No     Sexually Abused: No    Vital Signs: Blood pressure (!) 166/111, pulse 87, resp. rate 18, height 6\' 3"  (1.905 m), weight (!) 365 lb (165.6 kg), SpO2 93%. Body mass index is 45.62 kg/m.    Examination: General Appearance: The patient is well-developed, well-nourished, and in no distress. Neck Circumference: 48 cm Skin: Gross inspection of skin unremarkable. Head: normocephalic, no gross deformities. Eyes: no gross deformities noted. ENT: ears appear grossly normal Neurologic: Alert and oriented. No involuntary movements.  STOP BANG RISK ASSESSMENT S (snore) Have you been told that you snore?     NO   T (tired) Are you often tired, fatigued, or sleepy during the day?   NO  O (obstruction) Do you stop  breathing, choke, or gasp during sleep? NO   P (pressure) Do you have or are you being treated for high blood pressure? YES   B (BMI) Is your body index greater than 35 kg/m? YES   A (age) Are you 67 years old or older? YES   N (neck) Do you have a neck circumference greater than 16 inches?   YES   G (gender) Are you a male? YES   TOTAL STOP/BANG "YES" ANSWERS 5       A STOP-Bang score of 2 or less is considered low risk, and a score of 5 or more is high risk for having either moderate or severe OSA. For people who score 3 or 4, doctors may need to perform further assessment to determine how likely they are to have OSA.         EPWORTH SLEEPINESS SCALE:  Scale:  (0)= no chance of dozing; (1)= slight chance of dozing; (2)= moderate chance of dozing; (3)= high chance of dozing  Chance  Situtation    Sitting and reading: 0    Watching TV: 1    Sitting Inactive in public: 0    As a passenger in car: 1      Lying down to rest: 1    Sitting and talking: 0    Sitting quielty after lunch: 0    In a car, stopped in traffic: 0   TOTAL SCORE:   3 out of 24    SLEEP STUDIES:  PSG (08/15/22) AHI 54.5, min SPO2 73%                                                                                                  Titration (09/12/2022) - APAP 12-20cmh20   CPAP COMPLIANCE DATA:  Date Range: 01/13/2023-07/11/2023  Average Daily Use: 6 hours 15 min  Median Use: 6 hrs 13 min  Compliance for > 4 Hours: 73% days  AHI: 0.4 respiratory events per hour  Days Used: 162/180  Mask Leak: 37.9  95th Percentile Pressure: 14.3 cmh20         LABS: No results found for this or any previous visit (from the past 2160 hours).  Radiology: US Venous Img Lower Unilateral Right (DVT) Result Date: 01/09/2023 CLINICAL DATA:  right LE edema EXAM: RIGHT LOWER EXTREMITY VENOUS DOPPLER ULTRASOUND TECHNIQUE: Gray-scale sonography with compression, as well as color and duplex ultrasound, were performed to evaluate the deep venous system(s) from the level of the common femoral vein through the popliteal and proximal calf veins. COMPARISON:  CT AP, 10/09/2019. FINDINGS: VENOUS Normal compressibility of the common femoral, superficial femoral, and popliteal veins, as well as the visualized calf veins. Visualized portions of profunda femoral vein and great saphenous vein unremarkable. No filling defects to suggest DVT on grayscale or color Doppler imaging. Doppler waveforms show normal direction of venous flow, normal respiratory plasticity and response to augmentation. Limited views of the contralateral common femoral vein are unremarkable. OTHER No evidence of superficial thrombophlebitis or abnormal fluid collection. Limitations: none IMPRESSION: No evidence of femoropopliteal DVT or superficial thrombophlebitis within the RIGHT  lower extremity. Electronically Signed   By: Roanna Banning M.D.   On: 01/09/2023 13:21    No results found.  No results found.    Assessment and Plan: Patient Active Problem List   Diagnosis Date Noted   CPAP use counseling 01/08/2023   Smoker 01/08/2023   Morbid (severe) obesity due to excess calories (HCC) 12/14/2021   C. difficile diarrhea  10/10/2019   AKI (acute kidney injury) (HCC) 10/09/2019   Mood disorder (HCC) 06/21/2019   OSA (obstructive sleep apnea) 06/21/2019   Polyneuropathy 06/21/2019   Bilateral foot pain 05/22/2019   Numbness and tingling of both feet 05/22/2019   Pulmonary hypertension (HCC) 12/02/2018   Essential hypertension 11/29/2018   Chronic diarrhea 11/29/2018   Edema 11/29/2018   1. OSA (obstructive sleep apnea) (Primary) The patient does tolerate PAP and reports  benefit from PAP use. The patient was reminded how to clean equipment and advised to replace supplies routinely. The patient was also counselled on weight loss. The compliance is fair. The AHI is 0.4.   OSA on cpap- controlled. Continue with  good compliance with pap. CPAP continues to be medically necessary to treat this patient's OSA. F/u one year.    2. CPAP use counseling CPAP Counseling: had a lengthy discussion with the patient regarding the importance of PAP therapy in management of the sleep apnea. Patient appears to understand the risk factor reduction and also understands the risks associated with untreated sleep apnea. Patient will try to make a good faith effort to remain compliant with therapy. Also instructed the patient on proper cleaning of the device including the water must be changed daily if possible and use of distilled water is preferred. Patient understands that the machine should be regularly cleaned with appropriate recommended cleaning solutions that do not damage the PAP machine for example given white vinegar and water rinses. Other methods such as ozone treatment may not be as good as these simple methods to achieve cleaning.   3. Essential hypertension Elevated pressure discussed today. He will take his medication and f/u with his doctor.      General Counseling: I have discussed the findings of the evaluation and examination with Adonus.  I have also discussed any further diagnostic evaluation thatmay be needed or  ordered today. Josearmando verbalizes understanding of the findings of todays visit. We also reviewed his medications today and discussed drug interactions and side effects including but not limited excessive drowsiness and altered mental states. We also discussed that there is always a risk not just to him but also people around him. he has been encouraged to call the office with any questions or concerns that should arise related to todays visit.  No orders of the defined types were placed in this encounter.       I have personally obtained a history, examined the patient, evaluated laboratory and imaging results, formulated the assessment and plan and placed orders. This patient was seen today by Emmaline Kluver, PA-C in collaboration with Dr. Freda Munro.   Yevonne Pax, MD [facility] Diplomate ABMS Pulmonary Critical Care Medicine and Sleep Medicine

## 2023-07-16 ENCOUNTER — Ambulatory Visit (INDEPENDENT_AMBULATORY_CARE_PROVIDER_SITE_OTHER): Payer: 59 | Admitting: Internal Medicine

## 2023-07-16 VITALS — BP 166/111 | HR 87 | Resp 18 | Ht 75.0 in | Wt 365.0 lb

## 2023-07-16 DIAGNOSIS — Z7189 Other specified counseling: Secondary | ICD-10-CM

## 2023-07-16 DIAGNOSIS — I10 Essential (primary) hypertension: Secondary | ICD-10-CM | POA: Diagnosis not present

## 2023-07-16 DIAGNOSIS — G4733 Obstructive sleep apnea (adult) (pediatric): Secondary | ICD-10-CM

## 2023-07-16 NOTE — Patient Instructions (Signed)

## 2023-08-21 ENCOUNTER — Ambulatory Visit: Payer: 59 | Attending: Neurology

## 2023-08-21 DIAGNOSIS — R2681 Unsteadiness on feet: Secondary | ICD-10-CM | POA: Diagnosis present

## 2023-08-21 DIAGNOSIS — M6281 Muscle weakness (generalized): Secondary | ICD-10-CM | POA: Insufficient documentation

## 2023-08-21 DIAGNOSIS — R2689 Other abnormalities of gait and mobility: Secondary | ICD-10-CM | POA: Diagnosis present

## 2023-08-21 NOTE — Therapy (Signed)
 OUTPATIENT PHYSICAL THERAPY LOWER EXTREMITY EVALUATION   Patient Name: Wayne Wilkerson MRN: [id number] DOB:Jul 03, 1970, 53 y.o., male Today's Date: 08/21/2023  END OF SESSION:  PT End of Session - 08/21/23 1302     Visit Number 1    Number of Visits 8    Date for PT Re-Evaluation 11/02/23    PT Start Time 1146    PT Stop Time 1230    PT Time Calculation (min) 44 min    Activity Tolerance Patient tolerated treatment well    Behavior During Therapy [facility] for tasks assessed/performed   heavily distracted            Past Medical History:  Diagnosis Date   Bronchitis    COPD (chronic obstructive pulmonary disease) (HCC)    Glaucoma    Hypertension    Neuropathy    Pulmonary hypertension (HCC)    Sleep apnea    History reviewed. No pertinent surgical history. Patient Active Problem List   Diagnosis Date Noted   CPAP use counseling 01/08/2023   Smoker 01/08/2023   Morbid (severe) obesity due to excess calories (HCC) 12/14/2021   C. difficile diarrhea 10/10/2019   AKI (acute kidney injury) (HCC) 10/09/2019   Mood disorder (HCC) 06/21/2019   OSA (obstructive sleep apnea) 06/21/2019   Polyneuropathy 06/21/2019   Bilateral foot pain 05/22/2019   Numbness and tingling of both feet 05/22/2019   Pulmonary hypertension (HCC) 12/02/2018   Essential hypertension 11/29/2018   Chronic diarrhea 11/29/2018   Edema 11/29/2018    PCP: Flossie Buffy MD  REFERRING PROVIDER: Theora Master MD  REFERRING DIAG: G62.9 (ICD-10-CM) - Neuropathy   THERAPY DIAG:  Balance problem  Unsteadiness on feet  Muscle weakness (generalized)  Rationale for Evaluation and Treatment: Rehabilitation  ONSET DATE: Neuropathy began in 2016/09/06 became pronounced in September 06, 2017.   SUBJECTIVE:   SUBJECTIVE STATEMENT: " I have numbness, pins and needles  and ache which is controlled by Meds but sometimes it does not work. I am afraid of hurting. I have fell in the past but not afraid of falling. I use cane to be  on the safe side. Numbness in Blow the knee to the toes. Foot is worse.   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 Pt is disabled. Demonstrate poor hygiene and  heavily distracted. Refer to [facility] for more details.   PAIN:  Are you having pain? Yes: NPRS scale: 0/10 Pain description: when in pain its numb, pins and needles and tingling Aggravating factors: walking on rough surfaces, all prolonged activities of daily living makes it bad. Relieving factors: Meds  PRECAUTIONS: None  RED FLAGS: None   WEIGHT BEARING RESTRICTIONS: No  FALLS:  Has patient fallen in last 6 months? Yes. Number of falls 2  LIVING ENVIRONMENT: Lives with: lives alone Lives in: Mobile home Stairs: Yes: External: 1 steps; none Has following equipment at home: Single point cane  OCCUPATION: Disabled  PLOF: Independent  PATIENT GOALS: " I want to get around better and hope that my neuropathy improves."  NEXT MD VISIT: Podiatrist on March 6th  OBJECTIVE:  Note: Objective measures were completed at Evaluation unless otherwise noted.  DIAGNOSTIC FINDINGS: refer to EPIC  PATIENT SURVEYS:  LEFS 55/80  COGNITION: Overall cognitive status: Within functional limits for tasks assessed      SENSATION: Light touch: Impaired  Proprioception: Impaired  Sharp and dull impaired.   EDEMA: None noted  MUSCLE LENGTH: Hamstrings: WFL Thomas test: tightness noted.   POSTURE: rounded shoulders, forward head, decreased lumbar lordosis, increased thoracic kyphosis, and anterior pelvic tilt  PALPATION: Negative  LOWER EXTREMITY ROM: WFL   LOWER EXTREMITY MMT: B Hips 3-/5, B knees and ankle 4+/5  LUMBAR ROM: WFL without pain and mild tightness.   LOWER EXTREMITY SPECIAL TESTS:  N/A  FUNCTIONAL TESTS:  30 seconds chair stand test 8 Timed up and go (TUG): 14 secs 10 meter walk test: 11 secs = 1.1  Secs/meter SLS: Unable Standing with EC: Unable( Pt had Posterior LOB)   Tandem standing: 7 secs n BLE  TEST: Slump, quadrant and SLR negative.  GAIT: Distance walked: 50 Assistive device utilized: Single point cane Level of assistance: Modified independence Comments:                                                                                                                                 TREATMENT DATE: 07/24/2023  Low intensity Evaluation completed  TA: 10 mins PT discussed findings, fall risk, goals, prognosis, pt condition to establish pt specific POC.     PATIENT EDUCATION:  Education details: See above Person educated: Patient Education method: Medical illustrator Education comprehension: verbalized understanding and returned demonstration  HOME EXERCISE PROGRAM: Will be provided in future sessions.   ASSESSMENT:  CLINICAL IMPRESSION: Patient is a 53 y.o. distracted and pleasant male  who was seen today for physical therapy evaluation and treatment for Neuropathy in BLE below knees which is increasing pt fall risk and decreasing QOL. The symptoms are being managed by Meds which are effective. Pt's goal is to reduce symptoms and become safe with walking on uneven surface. PT assessment revealed Weakness of B hips, balance problems, decreased sensation and proprioception limiting pt with functions confirmed by balance test and LEFS score. PT explained prognosis and findings and pt agreed with POC  Pt will benefit PT interventions 1 x week x 8 weeks  to address impairments  and help pt achieve his goals.   OBJECTIVE IMPAIRMENTS: Abnormal gait, decreased activity tolerance, decreased balance, decreased endurance, difficulty walking, and decreased strength.   ACTIVITY LIMITATIONS: standing, sleeping, and bathing  PARTICIPATION LIMITATIONS: shopping and community activity  PERSONAL FACTORS: Behavior pattern are also affecting patient's functional outcome.    REHAB POTENTIAL: Fair Due to chronicity of the symptoms.   CLINICAL DECISION MAKING: Stable/uncomplicated  EVALUATION COMPLEXITY: Low   GOALS: Goals reviewed with patient? Yes  SHORT TERM GOALS: Target date: 08/10/2023 Pt will become independent with HEP to demonstrate compliance and proper carryover of the functional gains.  Baseline: TO be provided next session Goal status: INITIAL    LONG TERM GOALS: Target date: 11/02/2023  Pt will improve SLS standing to 10 secs to demonstrate  improved safety and balance Baseline: Unable Goal status: INITIAL  2.  Pt will stand on Blue foam with EC x 30 secs without LOB to demonstrate improved  proprioception and balance Baseline: Unable Goal status: INITIAL  3.  Pt will Improve B hip MMT by 1 grade to demonstrate improved LE function and safety.  Baseline: 3in B hips-/5 Goal status: INITIAL  4.  Pt will improve TUG time to <12 secs to demonstrate improved balance Baseline:  Goal status: INITIAL  5.  Pt will complete >14 STS in 30 secs   Baseline: 8 Goal status: INITIAL    PLAN:  PT FREQUENCY: 1-2x/week  PT DURATION: 8 weeks  PLANNED INTERVENTIONS: 97110-Therapeutic exercises, 97530- Therapeutic activity, 97112- Neuromuscular re-education, 97535- Self Care, [address]- Manual therapy, Balance training, Stair training, Joint mobilization, and Moist heat  PLAN FOR NEXT SESSION: Hip Muscle stretch, 3 way hip, balance activities.    Janet Berlin PT DPT 1:37 PM,08/21/23

## 2023-08-30 ENCOUNTER — Ambulatory Visit: Payer: 59

## 2023-08-30 DIAGNOSIS — R2681 Unsteadiness on feet: Secondary | ICD-10-CM

## 2023-08-30 DIAGNOSIS — R2689 Other abnormalities of gait and mobility: Secondary | ICD-10-CM | POA: Diagnosis not present

## 2023-08-30 NOTE — Therapy (Signed)
 OUTPATIENT PHYSICAL THERAPY LOWER EXTREMITY TREATMENT   Patient Name: Wayne Wilkerson MRN: [id number] DOB:April 21, 1971, 53 y.o., male Today's Date: 09/01/2023  END OF SESSION:  PT End of Session - 09/01/23 1520-09-11     Visit Number 2    Number of Visits 8    Date for PT Re-Evaluation 11/02/23    PT Start Time 1147    PT Stop Time 12-Sep-1226    PT Time Calculation (min) 41 min    Activity Tolerance Patient tolerated treatment well    Behavior During Therapy [facility] for tasks assessed/performed   heavily distracted            Past Medical History:  Diagnosis Date   Bronchitis    COPD (chronic obstructive pulmonary disease) (HCC)    Glaucoma    Hypertension    Neuropathy    Pulmonary hypertension (HCC)    Sleep apnea    History reviewed. No pertinent surgical history. Patient Active Problem List   Diagnosis Date Noted   CPAP use counseling 01/08/2023   Smoker 01/08/2023   Morbid (severe) obesity due to excess calories (HCC) 12/14/2021   C. difficile diarrhea 10/10/2019   AKI (acute kidney injury) (HCC) 10/09/2019   Mood disorder (HCC) 06/21/2019   OSA (obstructive sleep apnea) 06/21/2019   Polyneuropathy 06/21/2019   Bilateral foot pain 05/22/2019   Numbness and tingling of both feet 05/22/2019   Pulmonary hypertension (HCC) 12/02/2018   Essential hypertension 11/29/2018   Chronic diarrhea 11/29/2018   Edema 11/29/2018    PCP: Flossie Buffy MD  REFERRING PROVIDER: Theora Master MD  REFERRING DIAG: G62.9 (ICD-10-CM) - Neuropathy   THERAPY DIAG:  Balance problem  Unsteadiness on feet  Rationale for Evaluation and Treatment: Rehabilitation  ONSET DATE: Neuropathy began in Sep 11, 2016 became pronounced in 11-Sep-2017.   FROM INITIAL EVALUATION SUBJECTIVE:   SUBJECTIVE STATEMENT: " I have numbness, pins and needles  and ache which is controlled by Meds but sometimes it does not work. I am afraid of hurting. I have fell in the past but not afraid of falling. I use cane to be on the  safe side. Numbness in Blow the knee to the toes. Foot is worse.   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 Pt is disabled. Demonstrate poor hygiene and  heavily distracted. Refer to [facility] for more details.   PAIN:  Are you having pain? Yes: NPRS scale: 0/10 Pain description: when in pain its numb, pins and needles and tingling Aggravating factors: walking on rough surfaces, all prolonged activities of daily living makes it bad. Relieving factors: Meds  PRECAUTIONS: None  RED FLAGS: None   WEIGHT BEARING RESTRICTIONS: No  FALLS:  Has patient fallen in last 6 months? Yes. Number of falls 2  LIVING ENVIRONMENT: Lives with: lives alone Lives in: Mobile home Stairs: Yes: External: 1 steps; none Has following equipment at home: Single point cane  OCCUPATION: Disabled  PLOF: Independent  PATIENT GOALS: " I want to get around better and hope that my neuropathy improves."  NEXT MD VISIT: Podiatrist on March 6th  OBJECTIVE:  Note: Objective measures were completed at Evaluation unless otherwise noted.  DIAGNOSTIC FINDINGS: refer to EPIC  PATIENT SURVEYS:  LEFS 55/80  COGNITION: Overall cognitive status: Within functional limits for tasks assessed      SENSATION: Light touch: Impaired  Proprioception: Impaired  Sharp and dull impaired.   EDEMA: None noted  MUSCLE LENGTH: Hamstrings: WFL Thomas test: tightness noted.   POSTURE: rounded shoulders, forward head, decreased lumbar lordosis, increased thoracic kyphosis, and anterior pelvic tilt  PALPATION: Negative  LOWER EXTREMITY ROM: WFL   LOWER EXTREMITY MMT: B Hips 3-/5, B knees and ankle 4+/5  LUMBAR ROM: WFL without pain and mild tightness.   LOWER EXTREMITY SPECIAL TESTS:  N/A  FUNCTIONAL TESTS:  30 seconds chair stand test 8 Timed up and go (TUG): 14 secs 10 meter walk test: 11 secs = 1.1  Secs/meter SLS: Unable Standing with EC: Unable( Pt had Posterior LOB)   Tandem standing: 7 secs n BLE  TEST: Slump, quadrant and SLR negative.   GAIT: Distance walked: 50 Assistive device utilized: Single point cane Level of assistance: Modified independence Comments:                                                                                                                                 TREATMENT DATE:    SUBJECTIVE: Pt reports that he is doing well today. No changes since the initial evaluation. No recent falls. Denies pain. No specific questions or concerns.    PAIN: Denies   Ther-ex  NuStep L1-3 x 5 minutes for BLE strengthening and warm-up during interval history;  Standing exercises with 3# ankle weights (AW): Hip flexion marches x 10 BLE; Hamstring curls x 10 BLE; Hip abduction x 10 BLE; Hip extension x 10 BLE;  Sit to stand without UE support from regular height chair with Airex pad on seat x 10;   Neuromuscular Re-education  BERG: 50/56; Ascend/descend 4 steps with BUE support and alternating steps; FT eyes open/closed x 30s each; FT horizontal and vertical head turns x 30s each;   PATIENT EDUCATION:  Education details: See above Person educated: Patient Education method: Medical illustrator Education comprehension: verbalized understanding and returned demonstration  HOME EXERCISE PROGRAM: Will be provided in future sessions.   ASSESSMENT:  CLINICAL IMPRESSION: Performed BERG with patient who scored 50/56 indicating mild balance deficits. Initiated balance and strength exercises during session today. Unable to issue HEP but will perform at future session. Pt encouraged to follow-up as scheduled. Pt will benefit from PT services to address deficits in strength, balance, and mobility in order to return to full function at home and decrease his risk for falls.    OBJECTIVE IMPAIRMENTS: Abnormal gait, decreased activity tolerance,  decreased balance, decreased endurance, difficulty walking, and decreased strength.   ACTIVITY LIMITATIONS: standing, sleeping, and bathing  PARTICIPATION LIMITATIONS: shopping and community activity  PERSONAL FACTORS: Behavior pattern are also affecting patient's functional outcome.   REHAB POTENTIAL: Fair Due to chronicity of the symptoms.   CLINICAL DECISION MAKING: Stable/uncomplicated  EVALUATION COMPLEXITY: Low   GOALS: Goals reviewed with patient? Yes  SHORT TERM GOALS: Target date: 08/10/2023 Pt will become independent with HEP to demonstrate compliance and proper carryover of the functional gains.  Baseline: TO be provided next session  Goal status: INITIAL    LONG TERM GOALS: Target date: 11/02/2023  Pt will improve SLS standing to 10 secs to demonstrate  improved safety and balance Baseline: Unable Goal status: INITIAL  2.  Pt will stand on Blue foam with EC x 30 secs without LOB to demonstrate improved proprioception and balance Baseline: Unable Goal status: INITIAL  3.  Pt will Improve B hip MMT by 1 grade to demonstrate improved LE function and safety.  Baseline: 3in B hips-/5 Goal status: INITIAL  4.  Pt will improve TUG time to <12 secs to demonstrate improved balance Baseline:  Goal status: INITIAL  5.  Pt will complete >14 STS in 30 secs   Baseline: 8 Goal status: INITIAL    PLAN:  PT FREQUENCY: 1-2x/week  PT DURATION: 8 weeks  PLANNED INTERVENTIONS: 97110-Therapeutic exercises, 97530- Therapeutic activity, 97112- Neuromuscular re-education, 97535- Self Care, [id number]- Manual therapy, Balance training, Stair training, Joint mobilization, and Moist heat  PLAN FOR NEXT SESSION: Hip Muscle stretch, 3 way hip, balance activities.    Sharalyn Ink Sheenah Dimitroff PT, DPT, GCS  3:27 PM,09/01/23

## 2023-09-06 NOTE — Therapy (Signed)
 OUTPATIENT PHYSICAL THERAPY LOWER EXTREMITY TREATMENT   Patient Name: Wayne Wilkerson MRN: [id number] DOB:1971/03/25, 54 y.o., male Today's Date: 09/07/2023  END OF SESSION:  PT End of Session - 09/07/23 1016     Visit Number 3    Number of Visits 8    Date for PT Re-Evaluation 11/02/23    PT Start Time 1016    PT Stop Time 1057    PT Time Calculation (min) 41 min    Activity Tolerance Patient tolerated treatment well    Behavior During Therapy [facility] for tasks assessed/performed   heavily distracted             Past Medical History:  Diagnosis Date   Bronchitis    COPD (chronic obstructive pulmonary disease) (HCC)    Glaucoma    Hypertension    Neuropathy    Pulmonary hypertension (HCC)    Sleep apnea    History reviewed. No pertinent surgical history. Patient Active Problem List   Diagnosis Date Noted   CPAP use counseling 01/08/2023   Smoker 01/08/2023   Morbid (severe) obesity due to excess calories (HCC) 12/14/2021   C. difficile diarrhea 10/10/2019   AKI (acute kidney injury) (HCC) 10/09/2019   Mood disorder (HCC) 06/21/2019   OSA (obstructive sleep apnea) 06/21/2019   Polyneuropathy 06/21/2019   Bilateral foot pain 05/22/2019   Numbness and tingling of both feet 05/22/2019   Pulmonary hypertension (HCC) 12/02/2018   Essential hypertension 11/29/2018   Chronic diarrhea 11/29/2018   Edema 11/29/2018    PCP: Flossie Buffy MD  REFERRING PROVIDER: Theora Master MD  REFERRING DIAG: G62.9 (ICD-10-CM) - Neuropathy   THERAPY DIAG:  Balance problem  Unsteadiness on feet  Muscle weakness (generalized)  Rationale for Evaluation and Treatment: Rehabilitation  ONSET DATE: Neuropathy began in September 24, 2016 became pronounced in 09/24/17.   FROM INITIAL EVALUATION SUBJECTIVE:   SUBJECTIVE STATEMENT: " I have numbness, pins and needles  and ache which is controlled by Meds but sometimes it does not work. I am afraid of hurting. I have fell in the past but not afraid of  falling. I use cane to be on the safe side. Numbness in Blow the knee to the toes. Foot is worse.   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 Pt is disabled. Demonstrate poor hygiene and  heavily distracted. Refer to [facility] for more details.   PAIN:  Are you having pain? Yes: NPRS scale: 0/10 Pain description: when in pain its numb, pins and needles and tingling Aggravating factors: walking on rough surfaces, all prolonged activities of daily living makes it bad. Relieving factors: Meds  PRECAUTIONS: None  RED FLAGS: None   WEIGHT BEARING RESTRICTIONS: No  FALLS:  Has patient fallen in last 6 months? Yes. Number of falls 2  LIVING ENVIRONMENT: Lives with: lives alone Lives in: Mobile home Stairs: Yes: External: 1 steps; none Has following equipment at home: Single point cane  OCCUPATION: Disabled  PLOF: Independent  PATIENT GOALS: " I want to get around better and hope that my neuropathy improves."  NEXT MD VISIT: Podiatrist on March 6th  OBJECTIVE:  Note: Objective measures were completed at Evaluation unless otherwise noted.  DIAGNOSTIC FINDINGS: refer to EPIC  PATIENT SURVEYS:  LEFS 55/80  COGNITION: Overall cognitive status: Within functional limits for tasks assessed      SENSATION: Light touch: Impaired  Proprioception: Impaired  Sharp and dull impaired.  EDEMA: None noted  MUSCLE LENGTH: Hamstrings: WFL Thomas test: tightness noted.   POSTURE: rounded shoulders, forward head, decreased lumbar lordosis, increased thoracic kyphosis, and anterior pelvic tilt  PALPATION: Negative  LOWER EXTREMITY ROM: WFL   LOWER EXTREMITY MMT: B Hips 3-/5, B knees and ankle 4+/5  LUMBAR ROM: WFL without pain and mild tightness.   LOWER EXTREMITY SPECIAL TESTS:  N/A  FUNCTIONAL TESTS:  30 seconds chair stand test 8 Timed up and go (TUG): 14 secs 10 meter  walk test: 11 secs = 1.1 Secs/meter SLS: Unable Standing with EC: Unable( Pt had Posterior LOB)   Tandem standing: 7 secs n BLE  TEST: Slump, quadrant and SLR negative.   GAIT: Distance walked: 50 Assistive device utilized: Single point cane Level of assistance: Modified independence Comments:                                                                                                                                 TREATMENT DATE: 09/07/23  SUBJECTIVE: Patient denies any changes or complaints. Patient just got his shoe inserts redone recently? Didn't sleep well last night. Patient is looking for income driven housing. Current house is temporary.    PAIN: Denies   Ther-ex  NuStep L2-3 x 10 minutes for BLE strengthening and warm-up during interval history;  Sit to stand without UE support from regular height chair with Airex pad on seat 2 x 10;  -patient endorses not taking BP meds this date - argumentative when PT asks to assess BP before proceeding with exercises. Eventually agreeable with education on risks of elevated BP with exercise.    -BP 176/92  Standing with B UE support:   Hip flexion 2 x 10 BLE; Hip abduction 2 x 10 BLE; Heel slides 2 x 10 BLE;   HEP given - see below - patient agitated after HEP provided due to current social situation    PATIENT EDUCATION:  Education details: See above Person educated: Patient Education method: Medical illustrator Education comprehension: verbalized understanding and returned demonstration  HOME EXERCISE PROGRAM: Access Code: [email or web address] URL: [email or web address] Date: 09/07/2023 Prepared by: Maylon Peppers  Exercises - Standing Hip Abduction with Counter Support  - 2-3 x daily - 5-7 x weekly - 3 sets - 10 reps - Standing Hip Flexion with Counter Support  - 2-3 x daily - 5-7 x weekly - 3 sets - 10 reps - Heel Raises with Counter Support  - 2-3 x daily - 5-7 x weekly - 3 sets - 10 reps - Sit  to Stand with Arms Crossed  - 2-3 x daily - 5-7 x weekly - 3 sets - 10 reps  ASSESSMENT:  CLINICAL IMPRESSION:   Patient arrives to treatment session initially pleasant, however quickly becomes argumentative and agitated with HEP initiation and BP assessment due to endorsing not taking BP meds. Seems to be frustrated at current situation. Educated patient on risks of exercising with elevated  BP, patient reluctant to accept education. Patient encouraged to follow-up as scheduled. Pt will benefit from PT services to address deficits in strength, balance, and mobility in order to return to full function at home and decrease his risk for falls.    OBJECTIVE IMPAIRMENTS: Abnormal gait, decreased activity tolerance, decreased balance, decreased endurance, difficulty walking, and decreased strength.   ACTIVITY LIMITATIONS: standing, sleeping, and bathing  PARTICIPATION LIMITATIONS: shopping and community activity  PERSONAL FACTORS: Behavior pattern are also affecting patient's functional outcome.   REHAB POTENTIAL: Fair Due to chronicity of the symptoms.   CLINICAL DECISION MAKING: Stable/uncomplicated  EVALUATION COMPLEXITY: Low   GOALS: Goals reviewed with patient? Yes  SHORT TERM GOALS: Target date: 08/10/2023 Pt will become independent with HEP to demonstrate compliance and proper carryover of the functional gains.  Baseline: TO be provided next session Goal status: INITIAL    LONG TERM GOALS: Target date: 11/02/2023  Pt will improve SLS standing to 10 secs to demonstrate  improved safety and balance Baseline: Unable Goal status: INITIAL  2.  Pt will stand on Blue foam with EC x 30 secs without LOB to demonstrate improved proprioception and balance Baseline: Unable Goal status: INITIAL  3.  Pt will Improve B hip MMT by 1 grade to demonstrate improved LE function and safety.  Baseline: 3in B hips-/5 Goal status: INITIAL  4.  Pt will improve TUG time to <12 secs to demonstrate  improved balance Baseline:  Goal status: INITIAL  5.  Pt will complete >14 STS in 30 secs   Baseline: 8 Goal status: INITIAL    PLAN:  PT FREQUENCY: 1-2x/week  PT DURATION: 8 weeks  PLANNED INTERVENTIONS: 97110-Therapeutic exercises, 97530- Therapeutic activity, 97112- Neuromuscular re-education, 97535- Self Care, 96045- Manual therapy, Balance training, Stair training, Joint mobilization, and Moist heat  PLAN FOR NEXT SESSION: Hip Muscle stretch, 3 way hip, balance activities.    Maylon Peppers, PT, DPT Physical Therapist - [place]  [facility]  10:16 AM,09/07/23

## 2023-09-07 ENCOUNTER — Ambulatory Visit: Payer: 59

## 2023-09-07 DIAGNOSIS — R2681 Unsteadiness on feet: Secondary | ICD-10-CM

## 2023-09-07 DIAGNOSIS — R2689 Other abnormalities of gait and mobility: Secondary | ICD-10-CM | POA: Diagnosis not present

## 2023-09-07 DIAGNOSIS — M6281 Muscle weakness (generalized): Secondary | ICD-10-CM

## 2023-09-14 ENCOUNTER — Ambulatory Visit: Payer: 59

## 2023-09-19 ENCOUNTER — Ambulatory Visit: Payer: 59

## 2023-09-21 ENCOUNTER — Ambulatory Visit: Payer: 59

## 2023-09-26 ENCOUNTER — Ambulatory Visit: Payer: 59 | Attending: Neurology

## 2023-09-26 DIAGNOSIS — M6281 Muscle weakness (generalized): Secondary | ICD-10-CM | POA: Insufficient documentation

## 2023-09-26 DIAGNOSIS — R2681 Unsteadiness on feet: Secondary | ICD-10-CM | POA: Insufficient documentation

## 2023-09-26 NOTE — Therapy (Signed)
 OUTPATIENT PHYSICAL THERAPY LOWER EXTREMITY TREATMENT   Patient Name: Wayne Wilkerson MRN: [id number] DOB:04/24/1971, 53 y.o., male Today's Date: 09/26/2023  END OF SESSION:  PT End of Session - 09/26/23 1003     Visit Number 4    Number of Visits 8    Date for PT Re-Evaluation 11/02/23    Authorization Type eval: 08/21/23    PT Start Time 1015    PT Stop Time 1100    PT Time Calculation (min) 45 min    Activity Tolerance Patient tolerated treatment well    Behavior During Therapy [facility] for tasks assessed/performed   heavily distracted           Past Medical History:  Diagnosis Date   Bronchitis    COPD (chronic obstructive pulmonary disease) (HCC)    Glaucoma    Hypertension    Neuropathy    Pulmonary hypertension (HCC)    Sleep apnea    History reviewed. No pertinent surgical history. Patient Active Problem List   Diagnosis Date Noted   CPAP use counseling 01/08/2023   Smoker 01/08/2023   Morbid (severe) obesity due to excess calories (HCC) 12/14/2021   C. difficile diarrhea 10/10/2019   AKI (acute kidney injury) (HCC) 10/09/2019   Mood disorder (HCC) 06/21/2019   OSA (obstructive sleep apnea) 06/21/2019   Polyneuropathy 06/21/2019   Bilateral foot pain 05/22/2019   Numbness and tingling of both feet 05/22/2019   Pulmonary hypertension (HCC) 12/02/2018   Essential hypertension 11/29/2018   Chronic diarrhea 11/29/2018   Edema 11/29/2018    PCP: Flossie Buffy MD  REFERRING PROVIDER: Theora Master MD  REFERRING DIAG: G62.9 (ICD-10-CM) - Neuropathy   THERAPY DIAG:  Unsteadiness on feet  Muscle weakness (generalized)  Rationale for Evaluation and Treatment: Rehabilitation  ONSET DATE: Neuropathy began in 10/07/2016 became pronounced in 07-Oct-2017.   FROM INITIAL EVALUATION SUBJECTIVE:   SUBJECTIVE STATEMENT: " I have numbness, pins and needles  and ache which is controlled by Meds but sometimes it does not work. I am afraid of hurting. I have fell in the past but  not afraid of falling. I use cane to be on the safe side. Numbness in Blow the knee to the toes. Foot is worse.   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 Pt is disabled. Demonstrate poor hygiene and  heavily distracted. Refer to [facility] for more details.   PAIN:  Are you having pain? Yes: NPRS scale: 0/10 Pain description: when in pain its numb, pins and needles and tingling Aggravating factors: walking on rough surfaces, all prolonged activities of daily living makes it bad. Relieving factors: Meds  PRECAUTIONS: None  RED FLAGS: None   WEIGHT BEARING RESTRICTIONS: No  FALLS:  Has patient fallen in last 6 months? Yes. Number of falls 2  LIVING ENVIRONMENT: Lives with: lives alone Lives in: Mobile home Stairs: Yes: External: 1 steps; none Has following equipment at home: Single point cane  OCCUPATION: Disabled  PLOF: Independent  PATIENT GOALS: " I want to get around better and hope that my neuropathy improves."  NEXT MD VISIT: Podiatrist on March 6th  OBJECTIVE:  Note: Objective measures were completed at Evaluation unless otherwise noted.  DIAGNOSTIC FINDINGS: refer to EPIC  PATIENT SURVEYS:  LEFS 55/80  COGNITION: Overall cognitive status: Within functional limits for tasks assessed      SENSATION: Light touch: Impaired  Proprioception: Impaired  Sharp and dull  impaired.   EDEMA: None noted  MUSCLE LENGTH: Hamstrings: WFL Thomas test: tightness noted.   POSTURE: rounded shoulders, forward head, decreased lumbar lordosis, increased thoracic kyphosis, and anterior pelvic tilt  PALPATION: Negative  LOWER EXTREMITY ROM: WFL   LOWER EXTREMITY MMT: B Hips 3-/5, B knees and ankle 4+/5  LUMBAR ROM: WFL without pain and mild tightness.   LOWER EXTREMITY SPECIAL TESTS:  N/A  FUNCTIONAL TESTS:  30 seconds chair stand test 8 Timed up and go (TUG): 14  secs 10 meter walk test: 11 secs = 1.1 Secs/meter SLS: Unable Standing with EC: Unable( Pt had Posterior LOB)  Tandem standing: 7 secs n BLE  TEST: Slump, quadrant and SLR negative.   GAIT: Distance walked: 50 Assistive device utilized: Single point cane Level of assistance: Modified independence Comments:                                                                                                                            TREATMENT DATE: 09/26/23   SUBJECTIVE: Pt reports that he has been unable to come to therapy over the last couple weeks due to waist irritation from his belt. He had to wait for the irritation to heal. He did not perform his HEP that was issued at the last session.   PAIN: Denies pain upon arrival   Ther-ex  NuStep L2-4 x 10 minutes for BLE strengthening and warm-up during interval history;  Standing exercises with B UE support and 4# ankle weights (AW):  Hip flexion marching 2 x 10 BLE; Hip abduction 2 x 10 BLE; Hamstring curls 2 x 10 BLE; Hip extension 2 x 10 BLE;  Standing squats 2 x 10; Sit to stand without UE support from regular height chair with Airex pad on seat 2 x 10; Seated LAQ with 4# AW 2 x 10 BLE; Resisted side stepping with blue tband around ankles x multiple lengths;    PATIENT EDUCATION:  Education details: Pt educated throughout session about proper posture and technique with exercises. Improved exercise technique, movement at target joints, use of target muscles after min to mod verbal, visual, tactile cues.  Person educated: Patient Education method: Medical illustrator Education comprehension: verbalized understanding and returned demonstration   HOME EXERCISE PROGRAM: Access Code: [email or web address] URL: [email or web address] Date: 09/07/2023 Prepared by: Maylon Peppers  Exercises - Standing Hip Abduction with Counter Support  - 2-3 x daily - 5-7 x weekly - 3 sets - 10 reps - Standing Hip Flexion with  Counter Support  - 2-3 x daily - 5-7 x weekly - 3 sets - 10 reps - Heel Raises with Counter Support  - 2-3 x daily - 5-7 x weekly - 3 sets - 10 reps - Sit to Stand with Arms Crossed  - 2-3 x daily - 5-7 x weekly - 3 sets - 10 reps   ASSESSMENT:  CLINICAL IMPRESSION:   Progressed strengthening during session today. He denies any pain or discomfort. No HEP  modifications at this time. Patient encouraged to perform his HEP and follow-up as scheduled. He will benefit from PT services to address deficits in strength, balance, and mobility in order to return to full function at home.   OBJECTIVE IMPAIRMENTS: Abnormal gait, decreased activity tolerance, decreased balance, decreased endurance, difficulty walking, and decreased strength.   ACTIVITY LIMITATIONS: standing, sleeping, and bathing  PARTICIPATION LIMITATIONS: shopping and community activity  PERSONAL FACTORS: Behavior pattern are also affecting patient's functional outcome.   REHAB POTENTIAL: Fair Due to chronicity of the symptoms.   CLINICAL DECISION MAKING: Stable/uncomplicated  EVALUATION COMPLEXITY: Low   GOALS: Goals reviewed with patient? Yes  SHORT TERM GOALS: Target date: 08/10/2023 Pt will become independent with HEP to demonstrate compliance and proper carryover of the functional gains.  Baseline: TO be provided next session Goal status: INITIAL    LONG TERM GOALS: Target date: 11/02/2023  Pt will improve SLS standing to 10 secs to demonstrate  improved safety and balance Baseline: Unable Goal status: INITIAL  2.  Pt will stand on Blue foam with EC x 30 secs without LOB to demonstrate improved proprioception and balance Baseline: Unable Goal status: INITIAL  3.  Pt will Improve B hip MMT by 1 grade to demonstrate improved LE function and safety.  Baseline: 3in B hips-/5 Goal status: INITIAL  4.  Pt will improve TUG time to <12 secs to demonstrate improved balance Baseline:  Goal status: INITIAL  5.  Pt  will complete >14 STS in 30 secs   Baseline: 8 Goal status: INITIAL    PLAN:  PT FREQUENCY: 1-2x/week  PT DURATION: 8 weeks  PLANNED INTERVENTIONS: 97110-Therapeutic exercises, 97530- Therapeutic activity, 97112- Neuromuscular re-education, 97535- Self Care, [id number]- Manual therapy, Balance training, Stair training, Joint mobilization, and Moist heat  PLAN FOR NEXT SESSION: Hip Muscle stretch, 3 way hip, balance activities.    Lynnea Maizes PT, DPT, GCS  Physical Therapist - [place]  [facility] 11:04 AM,09/26/23

## 2023-09-28 ENCOUNTER — Ambulatory Visit: Payer: 59

## 2023-09-28 DIAGNOSIS — R2681 Unsteadiness on feet: Secondary | ICD-10-CM | POA: Diagnosis not present

## 2023-09-28 DIAGNOSIS — M6281 Muscle weakness (generalized): Secondary | ICD-10-CM

## 2023-09-28 NOTE — Therapy (Signed)
 OUTPATIENT PHYSICAL THERAPY LOWER EXTREMITY TREATMENT/GOAL UPDATE   Patient Name: Wayne Wilkerson MRN: [id number] DOB:04/28/1971, 53 y.o., male Today's Date: 09/28/2023  END OF SESSION:  PT End of Session - 09/28/23 1006     Visit Number 5    Number of Visits 8    Date for PT Re-Evaluation 11/02/23    Authorization Type eval: 08/21/23    PT Start Time 1010    PT Stop Time 1055    PT Time Calculation (min) 45 min    Activity Tolerance Patient tolerated treatment well    Behavior During Therapy [facility] for tasks assessed/performed   heavily distracted           Past Medical History:  Diagnosis Date   Bronchitis    COPD (chronic obstructive pulmonary disease) (HCC)    Glaucoma    Hypertension    Neuropathy    Pulmonary hypertension (HCC)    Sleep apnea    History reviewed. No pertinent surgical history. Patient Active Problem List   Diagnosis Date Noted   CPAP use counseling 01/08/2023   Smoker 01/08/2023   Morbid (severe) obesity due to excess calories (HCC) 12/14/2021   C. difficile diarrhea 10/10/2019   AKI (acute kidney injury) (HCC) 10/09/2019   Mood disorder (HCC) 06/21/2019   OSA (obstructive sleep apnea) 06/21/2019   Polyneuropathy 06/21/2019   Bilateral foot pain 05/22/2019   Numbness and tingling of both feet 05/22/2019   Pulmonary hypertension (HCC) 12/02/2018   Essential hypertension 11/29/2018   Chronic diarrhea 11/29/2018   Edema 11/29/2018    PCP: Flossie Buffy MD  REFERRING PROVIDER: Theora Master MD  REFERRING DIAG: G62.9 (ICD-10-CM) - Neuropathy   THERAPY DIAG:  Unsteadiness on feet  Muscle weakness (generalized)  Rationale for Evaluation and Treatment: Rehabilitation  ONSET DATE: Neuropathy began in 10/15/2016 became pronounced in 2017-10-15.   FROM INITIAL EVALUATION SUBJECTIVE:   SUBJECTIVE STATEMENT: " I have numbness, pins and needles  and ache which is controlled by Meds but sometimes it does not work. I am afraid of hurting. I have fell in  the past but not afraid of falling. I use cane to be on the safe side. Numbness in Blow the knee to the toes. Foot is worse.   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 Pt is disabled. Demonstrate poor hygiene and  heavily distracted. Refer to [facility] for more details.   PAIN:  Are you having pain? Yes: NPRS scale: 0/10 Pain description: when in pain its numb, pins and needles and tingling Aggravating factors: walking on rough surfaces, all prolonged activities of daily living makes it bad. Relieving factors: Meds  PRECAUTIONS: None  RED FLAGS:None   WEIGHT BEARING RESTRICTIONS: No  FALLS: Has patient fallen in last 6 months? Yes. Number of falls 2  LIVING ENVIRONMENT: Lives with: lives alone Lives in: Mobile home Stairs: Yes: External: 1 steps; none Has following equipment at home: Single point cane  OCCUPATION: Disabled  PLOF: Independent  PATIENT GOALS: " I want to get around better and hope that my neuropathy improves."  NEXT MD VISIT: Podiatrist on March 6th  OBJECTIVE:  Note: Objective measures were completed at Evaluation unless otherwise noted.  DIAGNOSTIC FINDINGS: refer to EPIC  PATIENT SURVEYS: LEFS 55/80  COGNITION:Overall cognitive status: Within functional limits for tasks assessed      SENSATION: Light touch: Impaired  Proprioception: Impaired  Sharp and dull impaired.  EDEMA: None noted  MUSCLE LENGTH: Hamstrings: WFL Thomas test: tightness noted.   POSTURE: rounded shoulders, forward head, decreased lumbar lordosis, increased thoracic kyphosis, and anterior pelvic tilt  PALPATION:Negative  LOWER EXTREMITY ROM: WFL   LOWER EXTREMITY MMT: B Hips 3-/5, B knees and ankle 4+/5  LUMBAR ROM: WFL without pain and mild tightness.   LOWER EXTREMITY SPECIAL TESTS: N/A  FUNCTIONAL TESTS:  30 seconds chair stand test 8 Timed up and go (TUG): 14  secs 10 meter walk test: 11 secs = 1.1 Secs/meter SLS: Unable Standing with EC: Unable( Pt had Posterior LOB)  Tandem standing: 7 secs n BLE  TEST: Slump, quadrant and SLR negative.   GAIT: Distance walked: 50 Assistive device utilized: Single point cane Level of assistance: Modified independence Comments:                                                                                                                            TREATMENT DATE: 09/28/23   SUBJECTIVE: Pt reports that he is doing well today. He has not been able to perform his HEP yet. No specific questions or concerns currently. Denies resting pain upon arrival.    PAIN: Denies pain upon arrival   Ther-ex  NuStep L2-4 x 10 minutes for BLE strengthening and warm-up during interval history (5 minutes unbilled); Updated outcome measures/goals with patient: Single leg stance: Approximately 1.5s-2s on each leg; EC foam: feet apart 10s, feet together: feet together 8s; TUG: 10.1s 30s Sit to Stand: 6 reps;  LE MMT: MMT (out of 5) Right  Left   Hip flexion 3+ 3+  Hip extension 3+ 3+  Hip abduction 3+ 3+  Hip adduction 3+ 3+  Hip internal rotation 4 4  Hip external rotation 4 4  (* = pain; Blank rows = not tested)  Standing exercises with B UE support and 5# ankle weights (AW):  Hip flexion marching 2 x 15 BLE; Hip abduction 2 x 15 BLE; Hamstring curls 2 x 15 BLE; Hip extension 2 x 15 BLE;  Standing heel raises 2 x 15; Seated LAQ with 5# AW 2 x 15 BLE;    PATIENT EDUCATION:  Education details: Pt educated throughout session about proper posture and technique with exercises. Improved exercise technique, movement at target joints, use of target muscles after min to mod verbal, visual, tactile cues. Plan of care, importance of HEP; Person educated: Patient Education method: Explanation and Demonstration Education comprehension: verbalized understanding and returned demonstration   HOME EXERCISE  PROGRAM: Access Code: [email or web address] URL: [email or web address] Date: 09/07/2023 Prepared by: Maylon Peppers  Exercises - Standing Hip Abduction with Counter Support  - 2-3 x daily - 5-7 x weekly - 3 sets - 10 reps - Standing Hip Flexion with Counter Support  - 2-3 x daily - 5-7 x weekly - 3 sets - 10 reps - Heel Raises with Counter Support  - 2-3 x daily - 5-7 x weekly - 3 sets - 10 reps -  Sit to Stand with Arms Crossed  - 2-3 x daily - 5-7 x weekly - 3 sets - 10 reps   ASSESSMENT:  CLINICAL IMPRESSION:   Updated goals with patient during visit today. His TUG improved and his eyes closed balance on unstable foam is also improved. His single leg balance is still severely limited and his 30s Sit to Stand Test worsened slightly to 6 reps (initially 8 reps). Both hip remain weak to MMT but possible slight improvement. Pt offered additional appointments but elected to perform his HEP and call in 1-2 weeks if he wants to schedule additional appointments. Progressed strengthening during session today. He denies any pain or discomfort. He will benefit from PT services to address deficits in strength, balance, and mobility in order to return to full function at home.   OBJECTIVE IMPAIRMENTS: Abnormal gait, decreased activity tolerance, decreased balance, decreased endurance, difficulty walking, and decreased strength.   ACTIVITY LIMITATIONS: standing, sleeping, and bathing  PARTICIPATION LIMITATIONS: shopping and community activity  PERSONAL FACTORS: Behavior pattern are also affecting patient's functional outcome.   REHAB POTENTIAL: Fair Due to chronicity of the symptoms.   CLINICAL DECISION MAKING: Stable/uncomplicated  EVALUATION COMPLEXITY: Low   GOALS: Goals reviewed with patient? Yes  SHORT TERM GOALS: Target date: 08/10/2023 Pt will become independent with HEP to demonstrate compliance and proper carryover of the functional gains.  Baseline: TO be provided next  session Goal status: INITIAL    LONG TERM GOALS: Target date: 11/02/2023  Pt will improve SLS standing to 10 secs to demonstrate  improved safety and balance Baseline: Unable Goal status: INITIAL  2.  Pt will stand on Blue foam with EC x 30 secs without LOB to demonstrate improved proprioception and balance Baseline: Unable Goal status: INITIAL  3.  Pt will Improve B hip MMT by 1 grade to demonstrate improved LE function and safety.  Baseline: 3in B hips-/5 Goal status: INITIAL  4.  Pt will improve TUG time to <12 secs to demonstrate improved balance Baseline:  Goal status: INITIAL  5.  Pt will complete >14 STS in 30 secs   Baseline: 8 Goal status: INITIAL    PLAN:  PT FREQUENCY: 1-2x/week  PT DURATION: 8 weeks  PLANNED INTERVENTIONS: 97110-Therapeutic exercises, 97530- Therapeutic activity, 97112- Neuromuscular re-education, 97535- Self Care, 16109- Manual therapy, Balance training, Stair training, Joint mobilization, and Moist heat  PLAN FOR NEXT SESSION: Hip Muscle stretch, 3 way hip, balance activities.    Lynnea Maizes PT, DPT, GCS  Physical Therapist - [place]  [facility] 11:22 AM,09/28/23

## 2024-01-04 ENCOUNTER — Other Ambulatory Visit: Payer: Self-pay | Admitting: Specialist

## 2024-01-04 DIAGNOSIS — F17210 Nicotine dependence, cigarettes, uncomplicated: Secondary | ICD-10-CM

## 2024-01-15 ENCOUNTER — Ambulatory Visit
Admission: RE | Admit: 2024-01-15 | Discharge: 2024-01-15 | Disposition: A | Discharge status: Home / Self Care | Source: Ambulatory Visit | Attending: Specialist | Admitting: Specialist

## 2024-01-15 DIAGNOSIS — F17210 Nicotine dependence, cigarettes, uncomplicated: Secondary | ICD-10-CM | POA: Diagnosis present

## 2024-01-22 ENCOUNTER — Ambulatory Visit: Payer: Self-pay | Admitting: Emergency Medicine

## 2024-01-23 NOTE — Progress Notes (Signed)
 CT results went to Dr. Burnett.

## 2024-03-12 ENCOUNTER — Other Ambulatory Visit: Payer: Self-pay | Admitting: Specialist

## 2024-03-12 DIAGNOSIS — R911 Solitary pulmonary nodule: Secondary | ICD-10-CM

## 2024-03-14 LAB — COLOGUARD: COLOGUARD: POSITIVE — AB

## 2024-04-14 ENCOUNTER — Ambulatory Visit
Admission: RE | Admit: 2024-04-14 | Discharge: 2024-04-14 | Disposition: A | Discharge status: Home / Self Care | Source: Ambulatory Visit | Attending: Specialist | Admitting: Specialist

## 2024-04-14 DIAGNOSIS — R911 Solitary pulmonary nodule: Secondary | ICD-10-CM | POA: Diagnosis present

## 2024-04-16 ENCOUNTER — Encounter
